# Patient Record
Sex: Female | Born: 1950 | Race: White | Hispanic: No | Marital: Married | Smoking: Never smoker
Health system: Southern US, Community
[De-identification: ages and names within clinical notes are randomized; demographics above are authoritative.]

## PROBLEM LIST (undated history)

## (undated) DIAGNOSIS — I10 Essential (primary) hypertension: Secondary | ICD-10-CM

## (undated) DIAGNOSIS — E039 Hypothyroidism, unspecified: Secondary | ICD-10-CM

## (undated) DIAGNOSIS — C50919 Malignant neoplasm of unspecified site of unspecified female breast: Secondary | ICD-10-CM

## (undated) HISTORY — PX: BREAST BIOPSY: SHX20

---

## 2019-06-28 DIAGNOSIS — Z01818 Encounter for other preprocedural examination: Secondary | ICD-10-CM | POA: Diagnosis not present

## 2019-06-29 DIAGNOSIS — Z0000 Encounter for general adult medical examination without abnormal findings: Secondary | ICD-10-CM | POA: Diagnosis not present

## 2019-06-29 DIAGNOSIS — Z13820 Encounter for screening for osteoporosis: Secondary | ICD-10-CM | POA: Diagnosis not present

## 2019-06-29 DIAGNOSIS — N959 Unspecified menopausal and perimenopausal disorder: Secondary | ICD-10-CM | POA: Diagnosis not present

## 2019-06-29 DIAGNOSIS — R300 Dysuria: Secondary | ICD-10-CM | POA: Diagnosis not present

## 2019-06-29 DIAGNOSIS — C8372 Burkitt lymphoma, intrathoracic lymph nodes: Secondary | ICD-10-CM | POA: Diagnosis not present

## 2019-07-07 DIAGNOSIS — M545 Low back pain: Secondary | ICD-10-CM | POA: Diagnosis not present

## 2019-07-07 DIAGNOSIS — M5136 Other intervertebral disc degeneration, lumbar region: Secondary | ICD-10-CM | POA: Diagnosis not present

## 2019-07-11 DIAGNOSIS — G4733 Obstructive sleep apnea (adult) (pediatric): Secondary | ICD-10-CM | POA: Diagnosis not present

## 2019-07-11 DIAGNOSIS — C8378 Burkitt lymphoma, lymph nodes of multiple sites: Secondary | ICD-10-CM | POA: Diagnosis not present

## 2019-07-11 DIAGNOSIS — Z79899 Other long term (current) drug therapy: Secondary | ICD-10-CM | POA: Diagnosis not present

## 2019-07-11 DIAGNOSIS — Z9989 Dependence on other enabling machines and devices: Secondary | ICD-10-CM | POA: Diagnosis not present

## 2019-07-16 DIAGNOSIS — M5136 Other intervertebral disc degeneration, lumbar region: Secondary | ICD-10-CM | POA: Diagnosis not present

## 2019-07-23 DIAGNOSIS — M5136 Other intervertebral disc degeneration, lumbar region: Secondary | ICD-10-CM | POA: Diagnosis not present

## 2019-07-23 DIAGNOSIS — M545 Low back pain: Secondary | ICD-10-CM | POA: Diagnosis not present

## 2019-07-23 DIAGNOSIS — M5416 Radiculopathy, lumbar region: Secondary | ICD-10-CM | POA: Diagnosis not present

## 2019-07-25 DIAGNOSIS — M8589 Other specified disorders of bone density and structure, multiple sites: Secondary | ICD-10-CM | POA: Diagnosis not present

## 2019-07-25 DIAGNOSIS — N959 Unspecified menopausal and perimenopausal disorder: Secondary | ICD-10-CM | POA: Diagnosis not present

## 2019-07-25 DIAGNOSIS — Z13820 Encounter for screening for osteoporosis: Secondary | ICD-10-CM | POA: Diagnosis not present

## 2019-07-25 DIAGNOSIS — Z780 Asymptomatic menopausal state: Secondary | ICD-10-CM | POA: Diagnosis not present

## 2019-07-31 DIAGNOSIS — M5416 Radiculopathy, lumbar region: Secondary | ICD-10-CM | POA: Diagnosis not present

## 2019-08-06 DIAGNOSIS — Z01818 Encounter for other preprocedural examination: Secondary | ICD-10-CM | POA: Diagnosis not present

## 2019-08-08 DIAGNOSIS — Z86010 Personal history of colonic polyps: Secondary | ICD-10-CM | POA: Diagnosis not present

## 2019-08-08 DIAGNOSIS — I10 Essential (primary) hypertension: Secondary | ICD-10-CM | POA: Diagnosis not present

## 2019-08-08 DIAGNOSIS — Z79899 Other long term (current) drug therapy: Secondary | ICD-10-CM | POA: Diagnosis not present

## 2019-08-08 DIAGNOSIS — F329 Major depressive disorder, single episode, unspecified: Secondary | ICD-10-CM | POA: Diagnosis not present

## 2019-08-08 DIAGNOSIS — D123 Benign neoplasm of transverse colon: Secondary | ICD-10-CM | POA: Diagnosis not present

## 2019-08-08 DIAGNOSIS — Z1211 Encounter for screening for malignant neoplasm of colon: Secondary | ICD-10-CM | POA: Diagnosis not present

## 2019-08-08 DIAGNOSIS — D125 Benign neoplasm of sigmoid colon: Secondary | ICD-10-CM | POA: Diagnosis not present

## 2019-08-08 DIAGNOSIS — Z9049 Acquired absence of other specified parts of digestive tract: Secondary | ICD-10-CM | POA: Diagnosis not present

## 2019-08-08 DIAGNOSIS — D120 Benign neoplasm of cecum: Secondary | ICD-10-CM | POA: Diagnosis not present

## 2019-08-08 DIAGNOSIS — K5730 Diverticulosis of large intestine without perforation or abscess without bleeding: Secondary | ICD-10-CM | POA: Diagnosis not present

## 2019-08-08 DIAGNOSIS — E039 Hypothyroidism, unspecified: Secondary | ICD-10-CM | POA: Diagnosis not present

## 2019-08-08 DIAGNOSIS — K649 Unspecified hemorrhoids: Secondary | ICD-10-CM | POA: Diagnosis not present

## 2019-08-10 DIAGNOSIS — D3132 Benign neoplasm of left choroid: Secondary | ICD-10-CM | POA: Diagnosis not present

## 2019-08-13 DIAGNOSIS — F4323 Adjustment disorder with mixed anxiety and depressed mood: Secondary | ICD-10-CM | POA: Diagnosis not present

## 2019-08-22 DIAGNOSIS — N390 Urinary tract infection, site not specified: Secondary | ICD-10-CM | POA: Diagnosis not present

## 2019-08-22 DIAGNOSIS — Z6831 Body mass index (BMI) 31.0-31.9, adult: Secondary | ICD-10-CM | POA: Diagnosis not present

## 2019-08-22 DIAGNOSIS — R300 Dysuria: Secondary | ICD-10-CM | POA: Diagnosis not present

## 2019-08-24 DIAGNOSIS — Z23 Encounter for immunization: Secondary | ICD-10-CM | POA: Diagnosis not present

## 2019-08-29 DIAGNOSIS — M545 Low back pain: Secondary | ICD-10-CM | POA: Diagnosis not present

## 2019-08-29 DIAGNOSIS — M5136 Other intervertebral disc degeneration, lumbar region: Secondary | ICD-10-CM | POA: Diagnosis not present

## 2019-09-20 DIAGNOSIS — F4323 Adjustment disorder with mixed anxiety and depressed mood: Secondary | ICD-10-CM | POA: Diagnosis not present

## 2019-09-21 DIAGNOSIS — Z23 Encounter for immunization: Secondary | ICD-10-CM | POA: Diagnosis not present

## 2019-11-12 DIAGNOSIS — J0100 Acute maxillary sinusitis, unspecified: Secondary | ICD-10-CM | POA: Diagnosis not present

## 2020-01-12 DIAGNOSIS — N3000 Acute cystitis without hematuria: Secondary | ICD-10-CM | POA: Diagnosis not present

## 2020-01-12 DIAGNOSIS — N3946 Mixed incontinence: Secondary | ICD-10-CM | POA: Diagnosis not present

## 2020-01-12 DIAGNOSIS — I10 Essential (primary) hypertension: Secondary | ICD-10-CM | POA: Diagnosis not present

## 2020-01-12 DIAGNOSIS — E039 Hypothyroidism, unspecified: Secondary | ICD-10-CM | POA: Diagnosis not present

## 2020-02-07 DIAGNOSIS — M67431 Ganglion, right wrist: Secondary | ICD-10-CM | POA: Diagnosis not present

## 2020-02-07 DIAGNOSIS — M25531 Pain in right wrist: Secondary | ICD-10-CM | POA: Diagnosis not present

## 2020-02-08 DIAGNOSIS — Z1231 Encounter for screening mammogram for malignant neoplasm of breast: Secondary | ICD-10-CM | POA: Diagnosis not present

## 2020-02-13 DIAGNOSIS — R922 Inconclusive mammogram: Secondary | ICD-10-CM | POA: Diagnosis not present

## 2020-02-26 DIAGNOSIS — C8372 Burkitt lymphoma, intrathoracic lymph nodes: Secondary | ICD-10-CM | POA: Diagnosis not present

## 2020-02-26 DIAGNOSIS — E039 Hypothyroidism, unspecified: Secondary | ICD-10-CM | POA: Diagnosis not present

## 2020-02-26 DIAGNOSIS — D649 Anemia, unspecified: Secondary | ICD-10-CM | POA: Diagnosis not present

## 2020-02-26 DIAGNOSIS — I10 Essential (primary) hypertension: Secondary | ICD-10-CM | POA: Diagnosis not present

## 2020-06-16 DIAGNOSIS — N390 Urinary tract infection, site not specified: Secondary | ICD-10-CM | POA: Diagnosis not present

## 2020-06-16 DIAGNOSIS — C8372 Burkitt lymphoma, intrathoracic lymph nodes: Secondary | ICD-10-CM | POA: Diagnosis not present

## 2020-06-16 DIAGNOSIS — R300 Dysuria: Secondary | ICD-10-CM | POA: Diagnosis not present

## 2020-06-16 DIAGNOSIS — R350 Frequency of micturition: Secondary | ICD-10-CM | POA: Diagnosis not present

## 2020-06-25 DIAGNOSIS — Z23 Encounter for immunization: Secondary | ICD-10-CM | POA: Diagnosis not present

## 2020-07-03 DIAGNOSIS — C8370 Burkitt lymphoma, unspecified site: Secondary | ICD-10-CM | POA: Diagnosis not present

## 2020-07-03 DIAGNOSIS — E039 Hypothyroidism, unspecified: Secondary | ICD-10-CM | POA: Diagnosis not present

## 2020-07-03 DIAGNOSIS — Z885 Allergy status to narcotic agent status: Secondary | ICD-10-CM | POA: Diagnosis not present

## 2020-07-03 DIAGNOSIS — Z79890 Hormone replacement therapy (postmenopausal): Secondary | ICD-10-CM | POA: Diagnosis not present

## 2020-07-03 DIAGNOSIS — F17200 Nicotine dependence, unspecified, uncomplicated: Secondary | ICD-10-CM | POA: Diagnosis not present

## 2020-07-03 DIAGNOSIS — C8373 Burkitt lymphoma, intra-abdominal lymph nodes: Secondary | ICD-10-CM | POA: Diagnosis not present

## 2020-07-03 DIAGNOSIS — Z801 Family history of malignant neoplasm of trachea, bronchus and lung: Secondary | ICD-10-CM | POA: Diagnosis not present

## 2020-07-03 DIAGNOSIS — I10 Essential (primary) hypertension: Secondary | ICD-10-CM | POA: Diagnosis not present

## 2020-07-03 DIAGNOSIS — Z9049 Acquired absence of other specified parts of digestive tract: Secondary | ICD-10-CM | POA: Diagnosis not present

## 2020-07-03 DIAGNOSIS — Z6831 Body mass index (BMI) 31.0-31.9, adult: Secondary | ICD-10-CM | POA: Diagnosis not present

## 2020-07-10 DIAGNOSIS — U071 COVID-19: Secondary | ICD-10-CM | POA: Diagnosis not present

## 2020-07-15 DIAGNOSIS — R0981 Nasal congestion: Secondary | ICD-10-CM | POA: Diagnosis not present

## 2020-07-15 DIAGNOSIS — R0602 Shortness of breath: Secondary | ICD-10-CM | POA: Diagnosis not present

## 2020-07-15 DIAGNOSIS — U071 COVID-19: Secondary | ICD-10-CM | POA: Diagnosis not present

## 2020-07-15 DIAGNOSIS — R059 Cough, unspecified: Secondary | ICD-10-CM | POA: Diagnosis not present

## 2020-07-15 DIAGNOSIS — M546 Pain in thoracic spine: Secondary | ICD-10-CM | POA: Diagnosis not present

## 2020-07-17 DIAGNOSIS — E039 Hypothyroidism, unspecified: Secondary | ICD-10-CM | POA: Diagnosis not present

## 2020-07-17 DIAGNOSIS — I10 Essential (primary) hypertension: Secondary | ICD-10-CM | POA: Diagnosis not present

## 2020-07-31 DIAGNOSIS — J0140 Acute pansinusitis, unspecified: Secondary | ICD-10-CM | POA: Diagnosis not present

## 2020-07-31 DIAGNOSIS — Z6830 Body mass index (BMI) 30.0-30.9, adult: Secondary | ICD-10-CM | POA: Diagnosis not present

## 2020-08-16 DIAGNOSIS — N390 Urinary tract infection, site not specified: Secondary | ICD-10-CM | POA: Diagnosis not present

## 2020-09-01 DIAGNOSIS — N390 Urinary tract infection, site not specified: Secondary | ICD-10-CM | POA: Diagnosis not present

## 2020-09-01 DIAGNOSIS — Z6830 Body mass index (BMI) 30.0-30.9, adult: Secondary | ICD-10-CM | POA: Diagnosis not present

## 2020-09-01 DIAGNOSIS — C8370 Burkitt lymphoma, unspecified site: Secondary | ICD-10-CM | POA: Diagnosis not present

## 2020-09-01 DIAGNOSIS — R319 Hematuria, unspecified: Secondary | ICD-10-CM | POA: Diagnosis not present

## 2020-09-18 DIAGNOSIS — Z6830 Body mass index (BMI) 30.0-30.9, adult: Secondary | ICD-10-CM | POA: Diagnosis not present

## 2020-09-18 DIAGNOSIS — N390 Urinary tract infection, site not specified: Secondary | ICD-10-CM | POA: Diagnosis not present

## 2020-09-18 DIAGNOSIS — R739 Hyperglycemia, unspecified: Secondary | ICD-10-CM | POA: Diagnosis not present

## 2020-09-18 DIAGNOSIS — R252 Cramp and spasm: Secondary | ICD-10-CM | POA: Diagnosis not present

## 2020-09-18 DIAGNOSIS — I10 Essential (primary) hypertension: Secondary | ICD-10-CM | POA: Diagnosis not present

## 2020-10-21 DIAGNOSIS — N390 Urinary tract infection, site not specified: Secondary | ICD-10-CM | POA: Diagnosis not present

## 2020-10-21 DIAGNOSIS — Z466 Encounter for fitting and adjustment of urinary device: Secondary | ICD-10-CM | POA: Diagnosis not present

## 2020-11-19 DIAGNOSIS — Z03818 Encounter for observation for suspected exposure to other biological agents ruled out: Secondary | ICD-10-CM | POA: Diagnosis not present

## 2020-11-19 DIAGNOSIS — K529 Noninfective gastroenteritis and colitis, unspecified: Secondary | ICD-10-CM | POA: Diagnosis not present

## 2020-11-24 DIAGNOSIS — N390 Urinary tract infection, site not specified: Secondary | ICD-10-CM | POA: Diagnosis not present

## 2020-11-24 DIAGNOSIS — Z6829 Body mass index (BMI) 29.0-29.9, adult: Secondary | ICD-10-CM | POA: Diagnosis not present

## 2021-01-10 DIAGNOSIS — Z20822 Contact with and (suspected) exposure to covid-19: Secondary | ICD-10-CM | POA: Diagnosis not present

## 2021-01-10 DIAGNOSIS — U071 COVID-19: Secondary | ICD-10-CM | POA: Diagnosis not present

## 2021-02-16 DIAGNOSIS — H903 Sensorineural hearing loss, bilateral: Secondary | ICD-10-CM | POA: Diagnosis not present

## 2021-03-27 DIAGNOSIS — G4762 Sleep related leg cramps: Secondary | ICD-10-CM | POA: Diagnosis not present

## 2021-03-27 DIAGNOSIS — I10 Essential (primary) hypertension: Secondary | ICD-10-CM | POA: Diagnosis not present

## 2021-03-27 DIAGNOSIS — M79671 Pain in right foot: Secondary | ICD-10-CM | POA: Diagnosis not present

## 2021-03-27 DIAGNOSIS — R252 Cramp and spasm: Secondary | ICD-10-CM | POA: Diagnosis not present

## 2021-03-27 DIAGNOSIS — Z0000 Encounter for general adult medical examination without abnormal findings: Secondary | ICD-10-CM | POA: Diagnosis not present

## 2021-03-27 DIAGNOSIS — M722 Plantar fascial fibromatosis: Secondary | ICD-10-CM | POA: Diagnosis not present

## 2021-03-27 DIAGNOSIS — R590 Localized enlarged lymph nodes: Secondary | ICD-10-CM | POA: Diagnosis not present

## 2021-04-07 ENCOUNTER — Other Ambulatory Visit: Payer: Self-pay | Admitting: Internal Medicine

## 2021-04-07 DIAGNOSIS — Z1231 Encounter for screening mammogram for malignant neoplasm of breast: Secondary | ICD-10-CM

## 2021-04-13 DIAGNOSIS — M25531 Pain in right wrist: Secondary | ICD-10-CM | POA: Diagnosis not present

## 2021-04-13 DIAGNOSIS — M67431 Ganglion, right wrist: Secondary | ICD-10-CM | POA: Diagnosis not present

## 2021-05-11 ENCOUNTER — Other Ambulatory Visit: Payer: Self-pay

## 2021-05-11 ENCOUNTER — Ambulatory Visit
Admission: RE | Admit: 2021-05-11 | Discharge: 2021-05-11 | Disposition: A | Discharge status: Home / Self Care | Payer: BC Managed Care – PPO | Source: Ambulatory Visit | Attending: Internal Medicine | Admitting: Internal Medicine

## 2021-05-11 DIAGNOSIS — Z1231 Encounter for screening mammogram for malignant neoplasm of breast: Secondary | ICD-10-CM

## 2021-05-12 DIAGNOSIS — R7303 Prediabetes: Secondary | ICD-10-CM | POA: Diagnosis not present

## 2021-05-12 DIAGNOSIS — E039 Hypothyroidism, unspecified: Secondary | ICD-10-CM | POA: Diagnosis not present

## 2021-05-12 DIAGNOSIS — Z0000 Encounter for general adult medical examination without abnormal findings: Secondary | ICD-10-CM | POA: Diagnosis not present

## 2021-05-12 DIAGNOSIS — N1831 Chronic kidney disease, stage 3a: Secondary | ICD-10-CM | POA: Diagnosis not present

## 2021-06-02 ENCOUNTER — Other Ambulatory Visit: Payer: Self-pay | Admitting: Internal Medicine

## 2021-06-02 DIAGNOSIS — R928 Other abnormal and inconclusive findings on diagnostic imaging of breast: Secondary | ICD-10-CM

## 2021-06-25 ENCOUNTER — Other Ambulatory Visit: Payer: Self-pay | Admitting: Internal Medicine

## 2021-06-25 ENCOUNTER — Ambulatory Visit
Admission: RE | Admit: 2021-06-25 | Discharge: 2021-06-25 | Disposition: A | Discharge status: Home / Self Care | Payer: BC Managed Care – PPO | Source: Ambulatory Visit | Attending: Internal Medicine | Admitting: Internal Medicine

## 2021-06-25 DIAGNOSIS — R922 Inconclusive mammogram: Secondary | ICD-10-CM | POA: Diagnosis not present

## 2021-06-25 DIAGNOSIS — R928 Other abnormal and inconclusive findings on diagnostic imaging of breast: Secondary | ICD-10-CM

## 2021-06-25 DIAGNOSIS — N6310 Unspecified lump in the right breast, unspecified quadrant: Secondary | ICD-10-CM

## 2021-06-26 DIAGNOSIS — R2231 Localized swelling, mass and lump, right upper limb: Secondary | ICD-10-CM | POA: Diagnosis not present

## 2021-06-26 DIAGNOSIS — G8918 Other acute postprocedural pain: Secondary | ICD-10-CM | POA: Diagnosis not present

## 2021-06-26 DIAGNOSIS — D2111 Benign neoplasm of connective and other soft tissue of right upper limb, including shoulder: Secondary | ICD-10-CM | POA: Diagnosis not present

## 2021-07-02 ENCOUNTER — Other Ambulatory Visit: Payer: BC Managed Care – PPO

## 2021-07-07 ENCOUNTER — Ambulatory Visit
Admission: RE | Admit: 2021-07-07 | Discharge: 2021-07-07 | Disposition: A | Discharge status: Home / Self Care | Payer: BC Managed Care – PPO | Source: Ambulatory Visit | Attending: Internal Medicine | Admitting: Internal Medicine

## 2021-07-07 DIAGNOSIS — C50411 Malignant neoplasm of upper-outer quadrant of right female breast: Secondary | ICD-10-CM | POA: Diagnosis not present

## 2021-07-07 DIAGNOSIS — N6310 Unspecified lump in the right breast, unspecified quadrant: Secondary | ICD-10-CM

## 2021-07-09 DIAGNOSIS — M25631 Stiffness of right wrist, not elsewhere classified: Secondary | ICD-10-CM | POA: Diagnosis not present

## 2021-07-09 DIAGNOSIS — Z4789 Encounter for other orthopedic aftercare: Secondary | ICD-10-CM | POA: Diagnosis not present

## 2021-07-14 DIAGNOSIS — C50911 Malignant neoplasm of unspecified site of right female breast: Secondary | ICD-10-CM | POA: Diagnosis not present

## 2021-07-17 DIAGNOSIS — C8370 Burkitt lymphoma, unspecified site: Secondary | ICD-10-CM | POA: Diagnosis not present

## 2021-07-17 DIAGNOSIS — Z6828 Body mass index (BMI) 28.0-28.9, adult: Secondary | ICD-10-CM | POA: Diagnosis not present

## 2021-07-17 DIAGNOSIS — C50911 Malignant neoplasm of unspecified site of right female breast: Secondary | ICD-10-CM | POA: Diagnosis not present

## 2021-07-19 HISTORY — PX: BREAST BIOPSY: SHX20

## 2021-08-04 DIAGNOSIS — C50911 Malignant neoplasm of unspecified site of right female breast: Secondary | ICD-10-CM | POA: Diagnosis not present

## 2021-08-04 DIAGNOSIS — Z170 Estrogen receptor positive status [ER+]: Secondary | ICD-10-CM | POA: Diagnosis not present

## 2021-08-04 DIAGNOSIS — I10 Essential (primary) hypertension: Secondary | ICD-10-CM | POA: Diagnosis not present

## 2021-08-04 DIAGNOSIS — C50919 Malignant neoplasm of unspecified site of unspecified female breast: Secondary | ICD-10-CM | POA: Diagnosis not present

## 2021-08-04 DIAGNOSIS — Z6829 Body mass index (BMI) 29.0-29.9, adult: Secondary | ICD-10-CM | POA: Diagnosis not present

## 2021-08-17 DIAGNOSIS — C50411 Malignant neoplasm of upper-outer quadrant of right female breast: Secondary | ICD-10-CM | POA: Diagnosis not present

## 2021-08-17 DIAGNOSIS — C50911 Malignant neoplasm of unspecified site of right female breast: Secondary | ICD-10-CM | POA: Diagnosis not present

## 2021-08-21 DIAGNOSIS — F32A Depression, unspecified: Secondary | ICD-10-CM | POA: Diagnosis not present

## 2021-08-21 DIAGNOSIS — C50911 Malignant neoplasm of unspecified site of right female breast: Secondary | ICD-10-CM | POA: Diagnosis not present

## 2021-08-21 DIAGNOSIS — Z885 Allergy status to narcotic agent status: Secondary | ICD-10-CM | POA: Diagnosis not present

## 2021-08-21 DIAGNOSIS — Z801 Family history of malignant neoplasm of trachea, bronchus and lung: Secondary | ICD-10-CM | POA: Diagnosis not present

## 2021-08-21 DIAGNOSIS — I10 Essential (primary) hypertension: Secondary | ICD-10-CM | POA: Diagnosis not present

## 2021-08-21 DIAGNOSIS — Z170 Estrogen receptor positive status [ER+]: Secondary | ICD-10-CM | POA: Diagnosis not present

## 2021-08-21 DIAGNOSIS — Z9889 Other specified postprocedural states: Secondary | ICD-10-CM | POA: Diagnosis not present

## 2021-08-21 DIAGNOSIS — E079 Disorder of thyroid, unspecified: Secondary | ICD-10-CM | POA: Diagnosis not present

## 2021-09-01 DIAGNOSIS — Z170 Estrogen receptor positive status [ER+]: Secondary | ICD-10-CM | POA: Diagnosis not present

## 2021-09-01 DIAGNOSIS — C50911 Malignant neoplasm of unspecified site of right female breast: Secondary | ICD-10-CM | POA: Diagnosis not present

## 2021-09-01 DIAGNOSIS — Z483 Aftercare following surgery for neoplasm: Secondary | ICD-10-CM | POA: Diagnosis not present

## 2021-09-01 DIAGNOSIS — Z9011 Acquired absence of right breast and nipple: Secondary | ICD-10-CM | POA: Diagnosis not present

## 2021-09-02 DIAGNOSIS — E079 Disorder of thyroid, unspecified: Secondary | ICD-10-CM | POA: Diagnosis not present

## 2021-09-02 DIAGNOSIS — Z8042 Family history of malignant neoplasm of prostate: Secondary | ICD-10-CM | POA: Diagnosis not present

## 2021-09-02 DIAGNOSIS — Z170 Estrogen receptor positive status [ER+]: Secondary | ICD-10-CM | POA: Diagnosis not present

## 2021-09-02 DIAGNOSIS — I10 Essential (primary) hypertension: Secondary | ICD-10-CM | POA: Diagnosis not present

## 2021-09-02 DIAGNOSIS — Z923 Personal history of irradiation: Secondary | ICD-10-CM | POA: Diagnosis not present

## 2021-09-02 DIAGNOSIS — M791 Myalgia, unspecified site: Secondary | ICD-10-CM | POA: Diagnosis not present

## 2021-09-02 DIAGNOSIS — Z6830 Body mass index (BMI) 30.0-30.9, adult: Secondary | ICD-10-CM | POA: Diagnosis not present

## 2021-09-02 DIAGNOSIS — C8373 Burkitt lymphoma, intra-abdominal lymph nodes: Secondary | ICD-10-CM | POA: Diagnosis not present

## 2021-09-02 DIAGNOSIS — C50911 Malignant neoplasm of unspecified site of right female breast: Secondary | ICD-10-CM | POA: Diagnosis not present

## 2021-09-02 DIAGNOSIS — Z801 Family history of malignant neoplasm of trachea, bronchus and lung: Secondary | ICD-10-CM | POA: Diagnosis not present

## 2021-09-02 DIAGNOSIS — Z792 Long term (current) use of antibiotics: Secondary | ICD-10-CM | POA: Diagnosis not present

## 2021-09-02 DIAGNOSIS — Z885 Allergy status to narcotic agent status: Secondary | ICD-10-CM | POA: Diagnosis not present

## 2021-09-04 DIAGNOSIS — L814 Other melanin hyperpigmentation: Secondary | ICD-10-CM | POA: Diagnosis not present

## 2021-09-04 DIAGNOSIS — L570 Actinic keratosis: Secondary | ICD-10-CM | POA: Diagnosis not present

## 2021-09-08 DIAGNOSIS — Z6829 Body mass index (BMI) 29.0-29.9, adult: Secondary | ICD-10-CM | POA: Diagnosis not present

## 2021-09-08 DIAGNOSIS — Z170 Estrogen receptor positive status [ER+]: Secondary | ICD-10-CM | POA: Diagnosis not present

## 2021-09-08 DIAGNOSIS — C50811 Malignant neoplasm of overlapping sites of right female breast: Secondary | ICD-10-CM | POA: Diagnosis not present

## 2022-05-24 ENCOUNTER — Emergency Department (HOSPITAL_COMMUNITY)
Admission: EM | Admit: 2022-05-24 | Discharge: 2022-05-24 | Disposition: A | Discharge status: Home / Self Care | Payer: No Typology Code available for payment source | Attending: Emergency Medicine | Admitting: Emergency Medicine

## 2022-05-24 ENCOUNTER — Emergency Department (HOSPITAL_COMMUNITY): Payer: No Typology Code available for payment source

## 2022-05-24 DIAGNOSIS — R0602 Shortness of breath: Secondary | ICD-10-CM | POA: Insufficient documentation

## 2022-05-24 DIAGNOSIS — R0781 Pleurodynia: Secondary | ICD-10-CM | POA: Diagnosis not present

## 2022-05-24 DIAGNOSIS — Y92410 Unspecified street and highway as the place of occurrence of the external cause: Secondary | ICD-10-CM | POA: Diagnosis not present

## 2022-05-24 DIAGNOSIS — M25512 Pain in left shoulder: Secondary | ICD-10-CM | POA: Insufficient documentation

## 2022-05-24 DIAGNOSIS — M25511 Pain in right shoulder: Secondary | ICD-10-CM | POA: Diagnosis not present

## 2022-05-24 MED ORDER — HYDROCODONE-ACETAMINOPHEN 5-325 MG PO TABS: 1.0000 | ORAL_TABLET | Freq: Four times a day (QID) | ORAL | 0 refills | Status: DC | PRN

## 2022-05-24 MED ORDER — METHOCARBAMOL 500 MG PO TABS: 500.0000 mg | ORAL_TABLET | Freq: Three times a day (TID) | ORAL | 0 refills | Status: DC | PRN

## 2022-06-28 ENCOUNTER — Encounter (HOSPITAL_BASED_OUTPATIENT_CLINIC_OR_DEPARTMENT_OTHER): Payer: Self-pay | Admitting: Emergency Medicine

## 2022-06-28 ENCOUNTER — Inpatient Hospital Stay (HOSPITAL_BASED_OUTPATIENT_CLINIC_OR_DEPARTMENT_OTHER)
Admission: EM | Admit: 2022-06-28 | Discharge: 2022-06-30 | DRG: 202 | Disposition: A | Discharge status: Home / Self Care | Payer: Medicare Other | Attending: Internal Medicine | Admitting: Internal Medicine

## 2022-06-28 ENCOUNTER — Emergency Department (HOSPITAL_BASED_OUTPATIENT_CLINIC_OR_DEPARTMENT_OTHER): Payer: Medicare Other

## 2022-06-28 ENCOUNTER — Other Ambulatory Visit: Payer: Self-pay

## 2022-06-28 ENCOUNTER — Encounter (HOSPITAL_COMMUNITY): Payer: Self-pay

## 2022-06-28 DIAGNOSIS — Z79890 Hormone replacement therapy (postmenopausal): Secondary | ICD-10-CM | POA: Diagnosis not present

## 2022-06-28 DIAGNOSIS — Z885 Allergy status to narcotic agent status: Secondary | ICD-10-CM

## 2022-06-28 DIAGNOSIS — J9601 Acute respiratory failure with hypoxia: Secondary | ICD-10-CM | POA: Diagnosis not present

## 2022-06-28 DIAGNOSIS — E785 Hyperlipidemia, unspecified: Secondary | ICD-10-CM | POA: Diagnosis not present

## 2022-06-28 DIAGNOSIS — E039 Hypothyroidism, unspecified: Secondary | ICD-10-CM | POA: Diagnosis not present

## 2022-06-28 DIAGNOSIS — Z79899 Other long term (current) drug therapy: Secondary | ICD-10-CM | POA: Diagnosis not present

## 2022-06-28 DIAGNOSIS — Z923 Personal history of irradiation: Secondary | ICD-10-CM | POA: Diagnosis not present

## 2022-06-28 DIAGNOSIS — J45901 Unspecified asthma with (acute) exacerbation: Secondary | ICD-10-CM | POA: Insufficient documentation

## 2022-06-28 DIAGNOSIS — I10 Essential (primary) hypertension: Secondary | ICD-10-CM | POA: Diagnosis not present

## 2022-06-28 DIAGNOSIS — Z1152 Encounter for screening for COVID-19: Secondary | ICD-10-CM

## 2022-06-28 DIAGNOSIS — Z853 Personal history of malignant neoplasm of breast: Secondary | ICD-10-CM | POA: Diagnosis not present

## 2022-06-28 DIAGNOSIS — B338 Other specified viral diseases: Secondary | ICD-10-CM

## 2022-06-28 DIAGNOSIS — C50919 Malignant neoplasm of unspecified site of unspecified female breast: Secondary | ICD-10-CM | POA: Diagnosis present

## 2022-06-28 DIAGNOSIS — J210 Acute bronchiolitis due to respiratory syncytial virus: Secondary | ICD-10-CM | POA: Diagnosis present

## 2022-06-28 DIAGNOSIS — J4521 Mild intermittent asthma with (acute) exacerbation: Secondary | ICD-10-CM | POA: Diagnosis not present

## 2022-06-28 HISTORY — DX: Malignant neoplasm of unspecified site of unspecified female breast: C50.919

## 2022-06-28 HISTORY — DX: Essential (primary) hypertension: I10

## 2022-06-28 HISTORY — DX: Hypothyroidism, unspecified: E03.9

## 2022-06-28 LAB — COMPREHENSIVE METABOLIC PANEL
ALT: 92 U/L — ABNORMAL HIGH (ref 0–44)
AST: 79 U/L — ABNORMAL HIGH (ref 15–41)
Albumin: 3.5 g/dL (ref 3.5–5.0)
Alkaline Phosphatase: 101 U/L (ref 38–126)
Anion gap: 12 (ref 5–15)
BUN: 14 mg/dL (ref 8–23)
CO2: 25 mmol/L (ref 22–32)
Calcium: 9.4 mg/dL (ref 8.9–10.3)
Chloride: 96 mmol/L — ABNORMAL LOW (ref 98–111)
Creatinine, Ser: 0.84 mg/dL (ref 0.44–1.00)
GFR, Estimated: >60 mL/min (ref >60)
Glucose, Bld: 139 mg/dL — ABNORMAL HIGH (ref 70–99)
Potassium: 3.7 mmol/L (ref 3.5–5.1)
Sodium: 133 mmol/L — ABNORMAL LOW (ref 135–145)
Total Bilirubin: 0.5 mg/dL (ref 0.3–1.2)
Total Protein: 7.6 g/dL (ref 6.5–8.1)

## 2022-06-28 LAB — URINALYSIS, MICROSCOPIC (REFLEX)

## 2022-06-28 LAB — RESP PANEL BY RT-PCR (RSV, FLU A&B, COVID)  RVPGX2
Influenza A by PCR: NEGATIVE
Influenza B by PCR: NEGATIVE
Resp Syncytial Virus by PCR: POSITIVE — AB
SARS Coronavirus 2 by RT PCR: NEGATIVE

## 2022-06-28 LAB — CBC WITH DIFFERENTIAL/PLATELET
Abs Immature Granulocytes: 0.08 10*3/uL — ABNORMAL HIGH (ref 0.00–0.07)
Basophils Absolute: 0 10*3/uL (ref 0.0–0.1)
Basophils Relative: 0 %
Eosinophils Absolute: 0 10*3/uL (ref 0.0–0.5)
Eosinophils Relative: 0 %
HCT: 30.9 % — ABNORMAL LOW (ref 36.0–46.0)
Hemoglobin: 10 g/dL — ABNORMAL LOW (ref 12.0–15.0)
Immature Granulocytes: 1 %
Lymphocytes Relative: 5 %
Lymphs Abs: 0.4 10*3/uL — ABNORMAL LOW (ref 0.7–4.0)
MCH: 28.7 pg (ref 26.0–34.0)
MCHC: 32.4 g/dL (ref 30.0–36.0)
MCV: 88.8 fL (ref 80.0–100.0)
Monocytes Absolute: 0.5 10*3/uL (ref 0.1–1.0)
Monocytes Relative: 7 %
Neutro Abs: 6.1 10*3/uL (ref 1.7–7.7)
Neutrophils Relative %: 87 %
Platelets: 159 10*3/uL (ref 150–400)
RBC: 3.48 MIL/uL — ABNORMAL LOW (ref 3.87–5.11)
RDW: 16.4 % — ABNORMAL HIGH (ref 11.5–15.5)
WBC: 7.1 10*3/uL (ref 4.0–10.5)
nRBC: 0 % (ref 0.0–0.2)

## 2022-06-28 LAB — URINALYSIS, ROUTINE W REFLEX MICROSCOPIC
Glucose, UA: 100 mg/dL — AB
Ketones, ur: 15 mg/dL — AB
Leukocytes,Ua: NEGATIVE
Nitrite: NEGATIVE
Protein, ur: >=300 mg/dL — AB
Specific Gravity, Urine: >=1.03 (ref 1.005–1.030)
pH: 5.5 (ref 5.0–8.0)

## 2022-06-28 LAB — LACTIC ACID, PLASMA: Lactic Acid, Venous: 1 mmol/L (ref 0.5–1.9)

## 2022-06-28 LAB — TROPONIN I (HIGH SENSITIVITY)
Troponin I (High Sensitivity): 33 ng/L — ABNORMAL HIGH (ref <18)
Troponin I (High Sensitivity): 20 ng/L — ABNORMAL HIGH (ref <18)

## 2022-06-28 LAB — BRAIN NATRIURETIC PEPTIDE: B Natriuretic Peptide: 134.2 pg/mL — ABNORMAL HIGH (ref 0.0–100.0)

## 2022-06-28 MED ORDER — ONDANSETRON HCL 4 MG/2ML IJ SOLN: 4.0000 mg | Freq: Four times a day (QID) | INTRAMUSCULAR | Status: DC | PRN

## 2022-06-28 MED ORDER — ENOXAPARIN SODIUM 40 MG/0.4ML IJ SOSY
40.0000 mg | PREFILLED_SYRINGE | INTRAMUSCULAR | Status: DC
  Administered 2022-06-28 – 2022-06-29 (×2): 40 mg via SUBCUTANEOUS
  Filled 2022-06-28 (×2): qty 0.4

## 2022-06-28 MED ORDER — SODIUM CHLORIDE 0.9 % IV BOLUS
500.0000 mL | Freq: Once | INTRAVENOUS | Status: AC
  Administered 2022-06-28: 500 mL via INTRAVENOUS

## 2022-06-28 MED ORDER — METHYLPREDNISOLONE SODIUM SUCC 125 MG IJ SOLR
125.0000 mg | Freq: Once | INTRAMUSCULAR | Status: AC
  Administered 2022-06-28: 125 mg via INTRAVENOUS
  Filled 2022-06-28: qty 2

## 2022-06-28 MED ORDER — HYDRALAZINE HCL 20 MG/ML IJ SOLN: 5.0000 mg | INTRAMUSCULAR | Status: DC | PRN

## 2022-06-28 MED ORDER — LETROZOLE 2.5 MG PO TABS
2.5000 mg | ORAL_TABLET | Freq: Every day | ORAL | Status: DC
  Administered 2022-06-29 – 2022-06-30 (×2): 2.5 mg via ORAL
  Filled 2022-06-28 (×2): qty 1

## 2022-06-28 MED ORDER — LEVOTHYROXINE SODIUM 112 MCG PO TABS
112.0000 ug | ORAL_TABLET | Freq: Every day | ORAL | Status: DC
  Administered 2022-06-29 – 2022-06-30 (×2): 112 ug via ORAL
  Filled 2022-06-28 (×2): qty 1

## 2022-06-28 MED ORDER — ACETAMINOPHEN 325 MG PO TABS: 650.0000 mg | ORAL_TABLET | Freq: Four times a day (QID) | ORAL | Status: DC | PRN

## 2022-06-28 MED ORDER — AMLODIPINE BESYLATE 2.5 MG PO TABS
2.5000 mg | ORAL_TABLET | Freq: Every day | ORAL | Status: DC
  Administered 2022-06-29 – 2022-06-30 (×2): 2.5 mg via ORAL
  Filled 2022-06-28 (×2): qty 1

## 2022-06-28 MED ORDER — MELATONIN 3 MG PO TABS
3.0000 mg | ORAL_TABLET | Freq: Once | ORAL | Status: AC
  Administered 2022-06-28: 3 mg via ORAL
  Filled 2022-06-28: qty 1

## 2022-06-28 MED ORDER — ACETAMINOPHEN 650 MG RE SUPP: 650.0000 mg | Freq: Four times a day (QID) | RECTAL | Status: DC | PRN

## 2022-06-28 MED ORDER — OXYCODONE HCL 5 MG PO TABS: 5.0000 mg | ORAL_TABLET | ORAL | Status: DC | PRN

## 2022-06-28 MED ORDER — GABAPENTIN 100 MG PO CAPS
100.0000 mg | ORAL_CAPSULE | Freq: Every day | ORAL | Status: DC
  Administered 2022-06-28 – 2022-06-29 (×2): 100 mg via ORAL
  Filled 2022-06-28 (×2): qty 1

## 2022-06-28 MED ORDER — ACETAMINOPHEN 500 MG PO TABS
1000.0000 mg | ORAL_TABLET | Freq: Once | ORAL | Status: AC
  Administered 2022-06-28: 1000 mg via ORAL
  Filled 2022-06-28: qty 2

## 2022-06-28 MED ORDER — IPRATROPIUM-ALBUTEROL 0.5-2.5 (3) MG/3ML IN SOLN
3.0000 mL | Freq: Four times a day (QID) | RESPIRATORY_TRACT | Status: DC
  Administered 2022-06-28 – 2022-06-29 (×3): 3 mL via RESPIRATORY_TRACT
  Filled 2022-06-28 (×3): qty 3

## 2022-06-28 MED ORDER — IPRATROPIUM-ALBUTEROL 0.5-2.5 (3) MG/3ML IN SOLN
3.0000 mL | Freq: Four times a day (QID) | RESPIRATORY_TRACT | Status: DC | PRN
  Administered 2022-06-28: 3 mL via RESPIRATORY_TRACT
  Filled 2022-06-28: qty 3

## 2022-06-28 MED ORDER — SODIUM CHLORIDE 0.9% FLUSH
3.0000 mL | Freq: Two times a day (BID) | INTRAVENOUS | Status: DC
  Administered 2022-06-29 – 2022-06-30 (×2): 3 mL via INTRAVENOUS
  Filled 2022-06-28: qty 3

## 2022-06-28 MED ORDER — POLYETHYLENE GLYCOL 3350 17 G PO PACK: 17.0000 g | PACK | Freq: Every day | ORAL | Status: DC | PRN

## 2022-06-28 MED ORDER — FLUTICASONE PROPIONATE 50 MCG/ACT NA SUSP
2.0000 | Freq: Every day | NASAL | Status: DC
  Administered 2022-06-29 – 2022-06-30 (×2): 2 via NASAL
  Filled 2022-06-28 (×2): qty 16

## 2022-06-28 MED ORDER — LACTATED RINGERS IV SOLN: INTRAVENOUS | Status: DC

## 2022-06-28 MED ORDER — ONDANSETRON HCL 4 MG PO TABS
4.0000 mg | ORAL_TABLET | Freq: Four times a day (QID) | ORAL | Status: DC | PRN
  Filled 2022-06-28: qty 1

## 2022-06-28 MED ORDER — ALBUTEROL SULFATE (2.5 MG/3ML) 0.083% IN NEBU
2.5000 mg | INHALATION_SOLUTION | RESPIRATORY_TRACT | Status: DC | PRN
  Administered 2022-06-28 – 2022-06-30 (×2): 2.5 mg via RESPIRATORY_TRACT
  Filled 2022-06-28 (×2): qty 3

## 2022-06-28 MED ORDER — BISACODYL 5 MG PO TBEC: 5.0000 mg | DELAYED_RELEASE_TABLET | Freq: Every day | ORAL | Status: DC | PRN

## 2022-06-28 MED ORDER — IPRATROPIUM-ALBUTEROL 0.5-2.5 (3) MG/3ML IN SOLN
3.0000 mL | Freq: Once | RESPIRATORY_TRACT | Status: AC
  Administered 2022-06-28: 3 mL via RESPIRATORY_TRACT
  Filled 2022-06-28: qty 3

## 2022-06-28 MED ORDER — ROSUVASTATIN CALCIUM 5 MG PO TABS
10.0000 mg | ORAL_TABLET | Freq: Every day | ORAL | Status: DC
  Administered 2022-06-29 – 2022-06-30 (×2): 10 mg via ORAL
  Filled 2022-06-28 (×2): qty 2

## 2022-06-28 NOTE — ED Notes (Signed)
ED Provider at bedside. 

## 2022-06-28 NOTE — ED Notes (Signed)
[facility] here to transport pt to [place]. 

## 2022-06-28 NOTE — Assessment & Plan Note (Signed)
-  s/p lumpectomy and radiation -Continue Femara

## 2022-06-28 NOTE — Assessment & Plan Note (Signed)
-   Continue amlodipine ?

## 2022-06-28 NOTE — ED Notes (Signed)
Pts spouse brought soup for pt.

## 2022-06-28 NOTE — Assessment & Plan Note (Addendum)
-  Patient presenting with cough, SOB, wheezing, viral symptoms -Found to have RSV -Hypoxia into the 80s with ambulation at the ER -Will observe overnight -Supportive care -Supplemental O2 as needed -CXR unremarkable -Nebs ordered

## 2022-06-28 NOTE — Assessment & Plan Note (Signed)
Continue Synthroid °

## 2022-06-28 NOTE — Assessment & Plan Note (Signed)
Continue rosuvastatin.  
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
## 2022-06-28 NOTE — ED Triage Notes (Signed)
Patient presents with family C/O cough, fever, diarrhea X5 days. Seen at PCP and dx with URI and given 2 different abx.

## 2022-06-28 NOTE — ED Notes (Signed)
1 set of blood cultures collected and held

## 2022-06-28 NOTE — ED Notes (Signed)
Bed side commode placed in room for pt comfort and convenience

## 2022-06-28 NOTE — H&P (Signed)
History and Physical   Patient: Victoria Zimmerman DOB: Aug 04, 1950 PCP: Merrilee Seashore, MD DOA: 06/28/2022 DOS: the patient was seen and examined on 06/28/2022 Primary service: Karmen Bongo, MD  Referring physician: Long Reason for consult: RSV.  Presented with flu-like illness.  RR 35, wheezing, h/o asthma.  Not eating well, mild diarrhea.  Febrile, tachypnea, dropping into 80s with ambulation.  Given nebs, Tylenol, IVF.  Increased wheezing with ongoing increased WOB.  Likely needs supportive care overnight with treatment for asthma.      Assessment and Plan: * RSV bronchiolitis -Patient presenting with cough, SOB, wheezing, viral symptoms -Found to have RSV -Hypoxia into the 80s with ambulation at the ER -Will observe overnight -Supportive care -Supplemental O2 as needed -CXR unremarkable -Nebs ordered  Breast cancer ([place]) -s/p lumpectomy and radiation -Continue Femara  Dyslipidemia -Continue rosuvastatin  Hypothyroidism -Continue Synthroid  Essential hypertension -Continue amlodipine       HPI: Victoria Zimmerman is a 71 y.o. female with past medical history of breast cancer, intermittent asthma, and HTN presenting with cough.  She reports that a week ago she went to [facility] for URI.  She was given steroids and antibiotics and was COVID negative.  She completed abx on Friday and was no better.  He called in a Zpack but she was no better and so called today.  She has a deep cough.  +SOB, weakness.  No home O2.  Cough is productive of yellow sputum.  If she eats, she has diarrhea.  Intermittent fever..  Initial visit was done via virtual visit while the patient was at [facility].  Subsequent visit was in person upon arrival at [facility].  She reports already feeling better, less SOB, improving cough.   Review of Systems: As mentioned in the history of present illness. All other systems reviewed and are negative. Past Medical History:  Diagnosis Date   Breast  cancer ([place])    just finished lumpectomy, taking hormonal therapy, completed radiation therapy   Hypertension    Hypothyroidism (acquired)    Past Surgical History:  Procedure Laterality Date   BREAST BIOPSY Right 07/2021   lumpectomy   Social History:  reports that she has never smoked. She has never used smokeless tobacco. She reports that she does not currently use alcohol. She reports that she does not currently use drugs.  Allergies  Allergen Reactions   Codeine Other (See Comments) and Nausea Only    Family History  Problem Relation Age of Onset   Breast cancer Neg Hx     Prior to Admission medications   Not on File    Physical Exam: Vitals:   06/28/22 1501 06/28/22 1530 06/28/22 1630 06/28/22 1804  BP: (!) 144/84 133/65 (!) 153/84 135/71  Pulse:  100 (!) 102 100  Resp: (!) 23 (!) 34 (!) 27 18  Temp: 98.1 F (36.7 C)  98.3 F (36.8 C) 99.2 F (37.3 C)  TempSrc: Oral  Oral Oral  SpO2: 97% 98% 100% 98%  Weight:      Height:        Televisit performed with assistance from RN:  General:  Appears calm and comfortable and is in NAD Eyes:  PERRL, EOMI, normal lids, iris ENT:  grossly normal hearing, lips & tongue, mild dry mm; appropriate dentition Neck:  no LAD, masses or thyromegaly Cardiovascular:  RR with mild tachycardia, no m/r/g. No LE edema.  Respiratory:   CTA bilaterally with diminished breath sounds, coarse cough.  Normal respiratory effort. Abdomen:  soft, mild diffuse TTP from coughing, ND, NABS Skin:  no rash or induration seen on limited exam Musculoskeletal:  grossly normal tone BUE/BLE, good ROM, no bony abnormality Psychiatric:  grossly normal mood and affect, speech fluent and appropriate, AOx3 Neurologic:  CN 2-12 grossly intact, moves all extremities in coordinated fashion   Radiological Exams on Admission: Independently reviewed - see discussion in A/P where applicable  DG Chest 2 View  Result Date: 06/28/2022 CLINICAL DATA:  Cough,  fever. EXAM: CHEST - 2 VIEW COMPARISON:  None Available. FINDINGS: The heart size and mediastinal contours are within normal limits. Both lungs are clear. The visualized skeletal structures are unremarkable. IMPRESSION: No active cardiopulmonary disease. Electronically Signed   By: Marijo Conception M.D.   On: 06/28/2022 10:12    EKG: Independently reviewed.  Sinus tachycardia with rate 117; nonspecific ST changes with no evidence of acute ischemia   Labs on Admission: I have personally reviewed the available labs and imaging studies at the time of the admission.  Pertinent labs:    Na++ 133 Glucose 139 BNP 134.2 HS troponin 33 Lactate 1.0 WBC 7.1 Hgb 10 COVID/flu negative RSV POSITIVE UA: small bili, 100 glucose, large LE, >300 protein, many bacteria Urine culture pending   Advance Care Planning:   Code Status: Full Code    Consults: TOC team/PT/OT/Nutrition   DVT Prophylaxis: Lovenox   Family Communication: None present; she is capable of communicating with her husband at this time (he is also developing a cough)     Severity of Illness: The appropriate patient status for this patient is OBSERVATION. Observation status is judged to be reasonable and necessary in order to provide the required intensity of service to ensure the patient's safety. The patient's presenting symptoms, physical exam findings, and initial radiographic and laboratory data in the context of their medical condition is felt to place them at decreased risk for further clinical deterioration. Furthermore, it is anticipated that the patient will be medically stable for discharge from the hospital within 2 midnights of admission.     Author: Karmen Bongo, MD 06/28/2022 6:28 PM  For on call review [email or web address].

## 2022-06-28 NOTE — ED Notes (Signed)
Hospitalist e-visit in process between patient and Dr Lorin Mercy.

## 2022-06-29 DIAGNOSIS — Z1152 Encounter for screening for COVID-19: Secondary | ICD-10-CM | POA: Diagnosis not present

## 2022-06-29 DIAGNOSIS — Z79899 Other long term (current) drug therapy: Secondary | ICD-10-CM | POA: Diagnosis not present

## 2022-06-29 DIAGNOSIS — J210 Acute bronchiolitis due to respiratory syncytial virus: Secondary | ICD-10-CM | POA: Diagnosis present

## 2022-06-29 DIAGNOSIS — J9601 Acute respiratory failure with hypoxia: Principal | ICD-10-CM | POA: Insufficient documentation

## 2022-06-29 DIAGNOSIS — I10 Essential (primary) hypertension: Secondary | ICD-10-CM | POA: Diagnosis present

## 2022-06-29 DIAGNOSIS — E039 Hypothyroidism, unspecified: Secondary | ICD-10-CM | POA: Diagnosis present

## 2022-06-29 DIAGNOSIS — J4521 Mild intermittent asthma with (acute) exacerbation: Secondary | ICD-10-CM | POA: Diagnosis present

## 2022-06-29 DIAGNOSIS — Z79890 Hormone replacement therapy (postmenopausal): Secondary | ICD-10-CM | POA: Diagnosis not present

## 2022-06-29 DIAGNOSIS — Z885 Allergy status to narcotic agent status: Secondary | ICD-10-CM | POA: Diagnosis not present

## 2022-06-29 DIAGNOSIS — E785 Hyperlipidemia, unspecified: Secondary | ICD-10-CM | POA: Diagnosis present

## 2022-06-29 DIAGNOSIS — J45901 Unspecified asthma with (acute) exacerbation: Secondary | ICD-10-CM | POA: Insufficient documentation

## 2022-06-29 DIAGNOSIS — Z853 Personal history of malignant neoplasm of breast: Secondary | ICD-10-CM | POA: Diagnosis not present

## 2022-06-29 DIAGNOSIS — Z923 Personal history of irradiation: Secondary | ICD-10-CM | POA: Diagnosis not present

## 2022-06-29 LAB — CBC
HCT: 28.9 % — ABNORMAL LOW (ref 36.0–46.0)
Hemoglobin: 9.3 g/dL — ABNORMAL LOW (ref 12.0–15.0)
MCH: 29.1 pg (ref 26.0–34.0)
MCHC: 32.2 g/dL (ref 30.0–36.0)
MCV: 90.3 fL (ref 80.0–100.0)
Platelets: 149 10*3/uL — ABNORMAL LOW (ref 150–400)
RBC: 3.2 MIL/uL — ABNORMAL LOW (ref 3.87–5.11)
RDW: 16.5 % — ABNORMAL HIGH (ref 11.5–15.5)
WBC: 7.4 10*3/uL (ref 4.0–10.5)
nRBC: 0 % (ref 0.0–0.2)

## 2022-06-29 LAB — HIV ANTIBODY (ROUTINE TESTING W REFLEX): HIV Screen 4th Generation wRfx: NONREACTIVE

## 2022-06-29 LAB — BASIC METABOLIC PANEL
Anion gap: 12 (ref 5–15)
BUN: 13 mg/dL (ref 8–23)
CO2: 24 mmol/L (ref 22–32)
Calcium: 9.1 mg/dL (ref 8.9–10.3)
Chloride: 101 mmol/L (ref 98–111)
Creatinine, Ser: 0.72 mg/dL (ref 0.44–1.00)
GFR, Estimated: >60 mL/min (ref >60)
Glucose, Bld: 176 mg/dL — ABNORMAL HIGH (ref 70–99)
Potassium: 3.7 mmol/L (ref 3.5–5.1)
Sodium: 137 mmol/L (ref 135–145)

## 2022-06-29 LAB — URINE CULTURE

## 2022-06-29 MED ORDER — ENSURE ENLIVE PO LIQD
237.0000 mL | Freq: Two times a day (BID) | ORAL | Status: DC
  Administered 2022-06-30: 237 mL via ORAL

## 2022-06-29 MED ORDER — ADULT MULTIVITAMIN W/MINERALS CH
1.0000 | ORAL_TABLET | Freq: Every day | ORAL | Status: DC
  Administered 2022-06-29 – 2022-06-30 (×2): 1 via ORAL
  Filled 2022-06-29 (×2): qty 1

## 2022-06-29 MED ORDER — METHYLPREDNISOLONE SODIUM SUCC 40 MG IJ SOLR
40.0000 mg | Freq: Two times a day (BID) | INTRAMUSCULAR | Status: DC
  Administered 2022-06-29 – 2022-06-30 (×3): 40 mg via INTRAVENOUS
  Filled 2022-06-29 (×3): qty 1

## 2022-06-29 MED ORDER — IPRATROPIUM-ALBUTEROL 0.5-2.5 (3) MG/3ML IN SOLN
3.0000 mL | Freq: Two times a day (BID) | RESPIRATORY_TRACT | Status: DC
  Administered 2022-06-30: 3 mL via RESPIRATORY_TRACT
  Filled 2022-06-29: qty 3

## 2022-06-29 NOTE — Progress Notes (Signed)
Initial Nutrition Assessment  DOCUMENTATION CODES:   Not applicable  INTERVENTION:   Multivitamin w/ minerals daily Ensure Enlive po BID, each supplement provides 350 kcal and 20 grams of protein. Encourage good PO intake  NUTRITION DIAGNOSIS:   Inadequate oral intake related to poor appetite as evidenced by per patient/family report.  GOAL:   Patient will meet greater than or equal to 90% of their needs  MONITOR:   PO intake, Supplement acceptance, I & O's  REASON FOR ASSESSMENT:   Consult  (Nutritional Goals)  ASSESSMENT:   71 y.o. female presented to the ED with flu-like illness. PMH includes HTN, breast cancer, and hypothyroidism. Pt admitted with RSV bronchiolitis.   Pt sleeping at time of RD visit, spoke with pt daughter that was present at bedside. Daughter able to provide history. Reports that pt had hardly anything to eat for 4 days PTA, maybe a slice of bread. Reports that last night ate 1/2 a yogurt, and today ate much better (about half a meal). Shares that prior, appetite was normal and eating much better. Pt was having diarrhea, but no nausea or vomiting. Denies any recent weight loss.  Discussed using oral nutritional supplements until pt PO improves, daughter shares that pt will drink them and is agreeable to RD ordering. Reviewed menu with daughter. No additional questions.   Medications reviewed and include: Solu-Medrol Labs reviewed.   NUTRITION - FOCUSED PHYSICAL EXAM:  Deferred to follow-up.  Diet Order:   Diet Order             Diet regular Room service appropriate? Yes; Fluid consistency: Thin  Diet effective now                   EDUCATION NEEDS:   No education needs have been identified at this time  Skin:  Skin Assessment: Reviewed RN Assessment  Last BM:  12/10  Height:   Ht Readings from Last 1 Encounters:  06/28/22 '5\' 8"'$  (1.727 m)    Weight:   Wt Readings from Last 1 Encounters:  06/28/22 83.9 kg    Ideal Body  Weight:  63.6 kg  BMI:  Body mass index is 28.13 kg/m.  Estimated Nutritional Needs:   Kcal:  1900-2100  Protein:  95-110 grams  Fluid:  >/= 1.9 L    Hermina Barters RD, LDN Clinical Dietitian See [facility] for contact information.

## 2022-06-29 NOTE — Progress Notes (Signed)
PROGRESS NOTE    Victoria Zimmerman Close Victoria Zimmerman  TOI:[id number] DOB: 1951/05/01 DOA: 06/28/2022 PCP: Merrilee Seashore, MD   Brief Narrative:  HPI: Victoria Zimmerman Close Victoria Zimmerman is a 71 y.o. female with past medical history of breast cancer, intermittent asthma, and HTN presenting with cough.  She reports that a week ago she went to [facility] for URI.  She was given steroids and antibiotics and was COVID negative.  She completed abx on Friday and was no better.  He called in a Zpack but she was no better and so called today.  She has a deep cough.  +SOB, weakness.  No home O2.  Cough is productive of yellow sputum.  If she eats, she has diarrhea.  Intermittent fever..  Initial visit was done via virtual visit while the patient was at [facility].  Subsequent visit was in person upon arrival at [facility].  She reports already feeling better, less SOB, improving cough.   Assessment & Plan:   Principal Problem:   RSV bronchiolitis Active Problems:   Essential hypertension   Hypothyroidism   Dyslipidemia   Breast cancer ([place])   Acute asthma exacerbation   Acute respiratory failure with hypoxia (HCC)  Acute hypoxic respiratory failure secondary to RSV bronchiolitis/acute asthma exacerbation: Patient is still wheezy and symptomatic and hypoxic, slightly better than yesterday but is still needs to stay in the hospital.  I will continue bronchodilators and will start her on Solu-Medrol today and will continue supportive treatment.  Potential discharge tomorrow.  Breast cancer ([place]) -s/p lumpectomy and radiation -Continue Femara   Dyslipidemia -Continue rosuvastatin   Hypothyroidism -Continue Synthroid   Essential hypertension -Continue amlodipine  DVT prophylaxis: enoxaparin (LOVENOX) injection 40 mg Start: 06/28/22 1800   Code Status: Full Code  Family Communication: Daughter present at bedside.  Plan of care discussed with patient in length and he/she verbalized understanding and agreed with it.  Status is:  Observation The patient will require care spanning > 2 midnights and should be moved to inpatient because: Patient still symptomatic and hypoxic   Estimated body mass index is 28.13 kg/m as calculated from the following:   Height as of this encounter: '5\' 8"'$  (1.727 m).   Weight as of this encounter: 83.9 kg.    Nutritional Assessment: Body mass index is 28.13 kg/m.Marland Kitchen Seen by dietician.  I agree with the assessment and plan as outlined below: Nutrition Status:        . Skin Assessment: I have examined the patient's skin and I agree with the wound assessment as performed by the wound care RN as outlined below:    Consultants:  None  Procedures:  None  Antimicrobials:  Anti-infectives (From admission, onward)    None         Subjective: Seen and examined.  Still complains of shortness of breath and cough.  No other complaint.  Slightly better than yesterday.  Objective: Vitals:   06/28/22 2138 06/29/22 0508 06/29/22 0815 06/29/22 0859  BP:  130/66 128/66   Pulse:  91 76   Resp:  20 17   Temp:  98.7 F (37.1 C) 98.7 F (37.1 C)   TempSrc:  Oral Oral   SpO2: 99% 95% 95% 95%  Weight:      Height:        Intake/Output Summary (Last 24 hours) at 06/29/2022 1000 Last data filed at 06/29/2022 0400 Gross per 24 hour  Intake 1157.03 ml  Output --  Net 1157.03 ml   [employer]  06/28/22 0910  Weight: 83.9 kg    Examination:  General exam: Appears sick and coughing. Respiratory system: Bilateral wheezes and scattered rhonchi. Respiratory effort normal. Cardiovascular system: S1 & S2 heard, RRR. No JVD, murmurs, rubs, gallops or clicks. No pedal edema. Gastrointestinal system: Abdomen is nondistended, soft and nontender. No organomegaly or masses felt. Normal bowel sounds heard. Central nervous system: Alert and oriented. No focal neurological deficits. Extremities: Symmetric 5 x 5 power. Skin: No rashes, lesions or ulcers Psychiatry: Judgement and  insight appear normal. Mood & affect appropriate.    Data Reviewed: I have personally reviewed following labs and imaging studies  CBC: Recent Labs  Lab 06/28/22 0940 06/29/22 0228  WBC 7.1 7.4  NEUTROABS 6.1  --   HGB 10.0* 9.3*  HCT 30.9* 28.9*  MCV 88.8 90.3  PLT 159 270*   Basic Metabolic Panel: Recent Labs  Lab 06/28/22 0940 06/29/22 0228  NA 133* 137  K 3.7 3.7  CL 96* 101  CO2 25 24  GLUCOSE 139* 176*  BUN 14 13  CREATININE 0.84 0.72  CALCIUM 9.4 9.1   GFR: Estimated Creatinine Clearance: 74.3 mL/min (by C-G formula based on SCr of 0.72 mg/dL). Liver Function Tests: Recent Labs  Lab 06/28/22 0940  AST 79*  ALT 92*  ALKPHOS 101  BILITOT 0.5  PROT 7.6  ALBUMIN 3.5   No results for input(s): "LIPASE", "AMYLASE" in the last 168 hours. No results for input(s): "AMMONIA" in the last 168 hours. Coagulation Profile: No results for input(s): "INR", "PROTIME" in the last 168 hours. Cardiac Enzymes: No results for input(s): "CKTOTAL", "CKMB", "CKMBINDEX", "TROPONINI" in the last 168 hours. BNP (last 3 results) No results for input(s): "PROBNP" in the last 8760 hours. HbA1C: No results for input(s): "HGBA1C" in the last 72 hours. CBG: No results for input(s): "GLUCAP" in the last 168 hours. Lipid Profile: No results for input(s): "CHOL", "HDL", "LDLCALC", "TRIG", "CHOLHDL", "LDLDIRECT" in the last 72 hours. Thyroid Function Tests: No results for input(s): "TSH", "T4TOTAL", "FREET4", "T3FREE", "THYROIDAB" in the last 72 hours. Anemia Panel: No results for input(s): "VITAMINB12", "FOLATE", "FERRITIN", "TIBC", "IRON", "RETICCTPCT" in the last 72 hours. Sepsis Labs: Recent Labs  Lab 06/28/22 0940  LATICACIDVEN 1.0    Recent Results (from the past 240 hour(s))  Resp panel by RT-PCR (RSV, Flu A&B, Covid) Anterior Nasal Swab     Status: Abnormal   Collection Time: 06/28/22  9:35 AM   Specimen: Anterior Nasal Swab  Result Value Ref Range Status   SARS  Coronavirus 2 by RT PCR NEGATIVE NEGATIVE Final    Comment: (NOTE) SARS-CoV-2 target nucleic acids are NOT DETECTED.  The SARS-CoV-2 RNA is generally detectable in upper respiratory specimens during the acute phase of infection. The lowest concentration of SARS-CoV-2 viral copies this assay can detect is 138 copies/mL. A negative result does not preclude SARS-Cov-2 infection and should not be used as the sole basis for treatment or other patient management decisions. A negative result may occur with  improper specimen collection/handling, submission of specimen other than nasopharyngeal swab, presence of viral mutation(s) within the areas targeted by this assay, and inadequate number of viral copies(<138 copies/mL). A negative result must be combined with clinical observations, patient history, and epidemiological information. The expected result is Negative.  Fact Sheet for Patients:  EntrepreneurPulse.com.au  Fact Sheet for Healthcare Providers:  IncredibleEmployment.be  This test is no t yet approved or cleared by the [place] FDA and  has been authorized for detection  and/or diagnosis of SARS-CoV-2 by FDA under an Emergency Use Authorization (EUA). This EUA will remain  in effect (meaning this test can be used) for the duration of the COVID-19 declaration under Section 564(b)(1) of the Act, 21 U.S.C.section 360bbb-3(b)(1), unless the authorization is terminated  or revoked sooner.       Influenza A by PCR NEGATIVE NEGATIVE Final   Influenza B by PCR NEGATIVE NEGATIVE Final    Comment: (NOTE) The Xpert Xpress SARS-CoV-2/FLU/RSV plus assay is intended as an aid in the diagnosis of influenza from Nasopharyngeal swab specimens and should not be used as a sole basis for treatment. Nasal washings and aspirates are unacceptable for Xpert Xpress SARS-CoV-2/FLU/RSV testing.  Fact Sheet for  Patients: EntrepreneurPulse.com.au  Fact Sheet for Healthcare Providers: IncredibleEmployment.be  This test is not yet approved or cleared by the [place] FDA and has been authorized for detection and/or diagnosis of SARS-CoV-2 by FDA under an Emergency Use Authorization (EUA). This EUA will remain in effect (meaning this test can be used) for the duration of the COVID-19 declaration under Section 564(b)(1) of the Act, 21 U.S.C. section 360bbb-3(b)(1), unless the authorization is terminated or revoked.     Resp Syncytial Virus by PCR POSITIVE (A) NEGATIVE Final    Comment: (NOTE) Fact Sheet for Patients: EntrepreneurPulse.com.au  Fact Sheet for Healthcare Providers: IncredibleEmployment.be  This test is not yet approved or cleared by the [place] FDA and has been authorized for detection and/or diagnosis of SARS-CoV-2 by FDA under an Emergency Use Authorization (EUA). This EUA will remain in effect (meaning this test can be used) for the duration of the COVID-19 declaration under Section 564(b)(1) of the Act, 21 U.S.C. section 360bbb-3(b)(1), unless the authorization is terminated or revoked.  Performed at [facility], [address], [place], [place] [address]   Urine Culture     Status: Abnormal   Collection Time: 06/28/22 10:03 AM   Specimen: Urine, Clean Catch  Result Value Ref Range Status   Specimen Description   Final    URINE, CLEAN CATCH Performed at [facility], [address] [place], [place] [address]    Special Requests   Final    NONE Performed at [facility], [address] [place], [place] [address]    Culture MULTIPLE SPECIES PRESENT, SUGGEST RECOLLECTION (A)  Final   Report Status 06/29/2022 FINAL  Final     Radiology Studies: DG Chest 2 View  Result Date: 06/28/2022 CLINICAL DATA:  Cough, fever. EXAM: CHEST - 2 VIEW COMPARISON:   None Available. FINDINGS: The heart size and mediastinal contours are within normal limits. Both lungs are clear. The visualized skeletal structures are unremarkable. IMPRESSION: No active cardiopulmonary disease. Electronically Signed   By: Marijo Conception M.D.   On: 06/28/2022 10:12    Scheduled Meds:  amLODipine  2.5 mg Oral Daily   enoxaparin (LOVENOX) injection  40 mg Subcutaneous Q24H   fluticasone  2 spray Each Nare Daily   gabapentin  100 mg Oral QHS   ipratropium-albuterol  3 mL Nebulization Q6H   letrozole  2.5 mg Oral Daily   levothyroxine  112 mcg Oral QAC breakfast   rosuvastatin  10 mg Oral Daily   sodium chloride flush  3 mL Intravenous Q12H   Continuous Infusions:  lactated ringers 75 mL/hr at 06/28/22 1827     LOS: 0 days   Darliss Cheney, MD [facility]  06/29/2022, 10:00 AM   *  Please note that this is a verbal dictation therefore any spelling or grammatical errors are due to the "[place]" system interpretation.  Please page via [place] and do not message via secure chat for urgent patient care matters. Secure chat can be used for non urgent patient care matters.  How to contact the [facility] Attending or Consulting provider [place] or covering provider during after hours [place], for this patient?  Check the care team in [facility] and look for a) attending/consulting TR[facility] provider listed and b) the [facility] team listed. Page or secure chat [phone]. Log into [email or web address] and use 's universal password to access. If you do not have the password, please contact the hospital operator. Locate the [facility] provider you are looking for under [facility] and page to a number that you can be directly reached. If you still have difficulty reaching the provider, please page the [facility] (Director on Call) for the Hospitalists listed on amion for assistance.

## 2022-06-29 NOTE — Plan of Care (Signed)
  Problem: Education: Goal: Knowledge of General Education information will improve Description: Including pain rating scale, medication(s)/side effects and non-pharmacologic comfort measures Outcome: Progressing   Problem: Coping: Goal: Level of anxiety will decrease Outcome: Progressing   Problem: Pain Managment: Goal: General experience of comfort will improve Outcome: Progressing   Problem: Safety: Goal: Ability to remain free from injury will improve Outcome: Progressing   Problem: Skin Integrity: Goal: Risk for impaired skin integrity will decrease Outcome: Progressing   Problem: Education: Goal: Knowledge of General Education information will improve Description: Including pain rating scale, medication(s)/side effects and non-pharmacologic comfort measures Outcome: Progressing   Problem: Coping: Goal: Level of anxiety will decrease Outcome: Progressing   Problem: Pain Managment: Goal: General experience of comfort will improve Outcome: Progressing   Problem: Safety: Goal: Ability to remain free from injury will improve Outcome: Progressing   Problem: Skin Integrity: Goal: Risk for impaired skin integrity will decrease Outcome: Progressing
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
## 2022-06-30 MED ORDER — IPRATROPIUM-ALBUTEROL 0.5-2.5 (3) MG/3ML IN SOLN: 3.0000 mL | Freq: Four times a day (QID) | RESPIRATORY_TRACT | 1 refills | Status: DC

## 2022-06-30 MED ORDER — PREDNISONE 10 MG PO TABS: 40.0000 mg | ORAL_TABLET | Freq: Every day | ORAL | 0 refills | Status: AC

## 2022-06-30 MED ORDER — IPRATROPIUM-ALBUTEROL 0.5-2.5 (3) MG/3ML IN SOLN
3.0000 mL | Freq: Four times a day (QID) | RESPIRATORY_TRACT | Status: DC
  Administered 2022-06-30: 3 mL via RESPIRATORY_TRACT
  Filled 2022-06-30 (×2): qty 3

## 2022-06-30 NOTE — Care Management (Signed)
  Transition of Care [facility]) Screening Note   Patient Details  Name: Victoria Zimmerman Date of Birth: 07-Dec-1950   Transition of Care [facility]) CM/SW Contact:    Carles Collet, RN Phone Number: 06/30/2022, 8:26 AM    Transition of Care Department [facility]) has reviewed patient and is following patient advancement through interdisciplinary progression rounds. If new patient transition needs arise, please place a TOC consult.

## 2022-06-30 NOTE — Discharge Summary (Signed)
Physician Discharge Summary   Patient: Victoria Zimmerman Close Victoria Zimmerman MRN: [id number] DOB: [birth year]  Admit date:     06/28/2022  Discharge date: 06/30/22  Discharge Physician: Elmarie Shiley   PCP: Merrilee Seashore, MD   Recommendations at discharge:    Follow up with PCP for resolution of RSV  Discharge Diagnoses: Principal Problem:   RSV bronchiolitis Active Problems:   Essential hypertension   Hypothyroidism   Dyslipidemia   Breast cancer ([place])   Acute asthma exacerbation   Acute respiratory failure with hypoxia ([place])  Resolved Problems:   * No resolved hospital problems. *  Hospital Course: [facility] Close Victoria Zimmerman is a 71 y.o. female with past medical history of breast cancer, intermittent asthma, and HTN presenting with cough.  She reports that a week ago she went to [facility] for URI.  She was given steroids and antibiotics and was COVID negative.  She completed abx on Friday and was no better.  He called in a Zpack but she was no better and so called today.  She has a deep cough.  +SOB, weakness.  No home O2.  Cough is productive of yellow sputum.  If she eats, she has diarrhea.  Intermittent fever..  Initial visit was done via virtual visit while the patient was at [facility].  Subsequent visit was in person upon arrival at [facility].  She reports already feeling better, less SOB, improving cough.    Assessment and Plan: Acute hypoxic respiratory failure secondary to RSV bronchiolitis/acute asthma exacerbation:  She was treated bronchodilators and IV Solu-Medrol. Continue supportive treatment.  She is feeling better, off oxygen. She has been ambulating in the hall.  Cough and dyspnea improved.  Plan to discharge home on duo-neb and prednisone for 5 days.  Stable for discharge.   Breast cancer ([place]) -s/p lumpectomy and radiation -Continue Femara   Dyslipidemia -Continue rosuvastatin   Hypothyroidism -Continue Synthroid   Essential hypertension -Continue amlodipine      Consultants:  None Procedures performed: none Disposition: Home Diet recommendation:  Discharge Diet Orders (From admission, onward)     Start     Ordered   06/30/22 0000  Diet - low sodium heart healthy        06/30/22 1142           Hearth healthy  DISCHARGE MEDICATION: Allergies as of 06/30/2022       Reactions   Codeine Other (See Comments), Nausea Only        Medication List     TAKE these medications    acetaminophen 325 MG tablet Commonly known as: TYLENOL Take 650 mg by mouth every 6 (six) hours as needed for mild pain or moderate pain.   amLODipine 2.5 MG tablet Commonly known as: NORVASC Take 2.5 mg by mouth daily.   fluticasone 50 MCG/ACT nasal spray Commonly known as: FLONASE Place into both nostrils.   gabapentin 100 MG capsule Commonly known as: NEURONTIN Take 100 mg by mouth at bedtime.   HYDROcodone-acetaminophen 5-325 MG tablet Commonly known as: NORCO/VICODIN Take by mouth.   ipratropium-albuterol 0.5-2.5 (3) MG/3ML Soln Commonly known as: DUONEB Take 3 mLs by nebulization every 6 (six) hours.   letrozole 2.5 MG tablet Commonly known as: FEMARA Take 2.5 mg by mouth daily.   levothyroxine 112 MCG tablet Commonly known as: SYNTHROID Take 112 mcg by mouth daily before breakfast. ON AN EMPTY STOMACH   predniSONE 10 MG tablet Commonly known as: DELTASONE Take 4 tablets (40 mg total) by mouth daily  with breakfast for 5 days.   rosuvastatin 10 MG tablet Commonly known as: CRESTOR Take 10 mg by mouth daily.               Durable Medical Equipment  (From admission, onward)           Start     Ordered   06/30/22 1138  For home use only DME Nebulizer machine  Once       Comments: Asthma exacerbation  Question Answer Comment  Patient needs a nebulizer to treat with the following condition Bronchitis   Length of Need 6 Months      06/30/22 1138            Discharge Exam: Filed Weights   06/28/22 0910  Weight: 83.9 kg    General NAD  Condition at discharge: stable  The results of significant diagnostics from this hospitalization (including imaging, microbiology, ancillary and laboratory) are listed below for reference.   Imaging Studies: DG Chest 2 View  Result Date: 06/28/2022 CLINICAL DATA:  Cough, fever. EXAM: CHEST - 2 VIEW COMPARISON:  None Available. FINDINGS: The heart size and mediastinal contours are within normal limits. Both lungs are clear. The visualized skeletal structures are unremarkable. IMPRESSION: No active cardiopulmonary disease. Electronically Signed   By: Marijo Conception M.D.   On: 06/28/2022 10:12    Microbiology: Results for orders placed or performed during the hospital encounter of 06/28/22  Resp panel by RT-PCR (RSV, Flu A&B, Covid) Anterior Nasal Swab     Status: Abnormal   Collection Time: 06/28/22  9:35 AM   Specimen: Anterior Nasal Swab  Result Value Ref Range Status   SARS Coronavirus 2 by RT PCR NEGATIVE NEGATIVE Final    Comment: (NOTE) SARS-CoV-2 target nucleic acids are NOT DETECTED.  The SARS-CoV-2 RNA is generally detectable in upper respiratory specimens during the acute phase of infection. The lowest concentration of SARS-CoV-2 viral copies this assay can detect is 138 copies/mL. A negative result does not preclude SARS-Cov-2 infection and should not be used as the sole basis for treatment or other patient management decisions. A negative result may occur with  improper specimen collection/handling, submission of specimen other than nasopharyngeal swab, presence of viral mutation(s) within the areas targeted by this assay, and inadequate number of viral copies(<138 copies/mL). A negative result must be combined with clinical observations, patient history, and epidemiological information. The expected result is Negative.  Fact Sheet for Patients:  EntrepreneurPulse.com.au  Fact Sheet for Healthcare Providers:   IncredibleEmployment.be  This test is no t yet approved or cleared by the [place] FDA and  has been authorized for detection and/or diagnosis of SARS-CoV-2 by FDA under an Emergency Use Authorization (EUA). This EUA will remain  in effect (meaning this test can be used) for the duration of the COVID-19 declaration under Section 564(b)(1) of the Act, 21 U.S.C.section 360bbb-3(b)(1), unless the authorization is terminated  or revoked sooner.       Influenza A by PCR NEGATIVE NEGATIVE Final   Influenza B by PCR NEGATIVE NEGATIVE Final    Comment: (NOTE) The Xpert Xpress SARS-CoV-2/FLU/RSV plus assay is intended as an aid in the diagnosis of influenza from Nasopharyngeal swab specimens and should not be used as a sole basis for treatment. Nasal washings and aspirates are unacceptable for Xpert Xpress SARS-CoV-2/FLU/RSV testing.  Fact Sheet for Patients: EntrepreneurPulse.com.au  Fact Sheet for Healthcare Providers: IncredibleEmployment.be  This test is not yet approved or cleared by the [place]  FDA and has been authorized for detection and/or diagnosis of SARS-CoV-2 by FDA under an Emergency Use Authorization (EUA). This EUA will remain in effect (meaning this test can be used) for the duration of the COVID-19 declaration under Section 564(b)(1) of the Act, 21 U.S.C. section 360bbb-3(b)(1), unless the authorization is terminated or revoked.     Resp Syncytial Virus by PCR POSITIVE (A) NEGATIVE Final    Comment: (NOTE) Fact Sheet for Patients: EntrepreneurPulse.com.au  Fact Sheet for Healthcare Providers: IncredibleEmployment.be  This test is not yet approved or cleared by the [place] FDA and has been authorized for detection and/or diagnosis of SARS-CoV-2 by FDA under an Emergency Use Authorization (EUA). This EUA will remain in effect (meaning this test can be used)  for the duration of the COVID-19 declaration under Section 564(b)(1) of the Act, 21 U.S.C. section 360bbb-3(b)(1), unless the authorization is terminated or revoked.  Performed at [facility], [address] [place], [place] [address]   Urine Culture     Status: Abnormal   Collection Time: 06/28/22 10:03 AM   Specimen: Urine, Clean Catch  Result Value Ref Range Status   Specimen Description   Final    URINE, CLEAN CATCH Performed at [facility], [address] [place], [place] [address]    Special Requests   Final    NONE Performed at [facility], [address] [place], [place] [address]    Culture MULTIPLE SPECIES PRESENT, SUGGEST RECOLLECTION (A)  Final   Report Status 06/29/2022 FINAL  Final    Labs: CBC: Recent Labs  Lab 06/28/22 0940 06/29/22 0228  WBC 7.1 7.4  NEUTROABS 6.1  --   HGB 10.0* 9.3*  HCT 30.9* 28.9*  MCV 88.8 90.3  PLT 159 142*   Basic Metabolic Panel: Recent Labs  Lab 06/28/22 0940 06/29/22 0228  NA 133* 137  K 3.7 3.7  CL 96* 101  CO2 25 24  GLUCOSE 139* 176*  BUN 14 13  CREATININE 0.84 0.72  CALCIUM 9.4 9.1   Liver Function Tests: Recent Labs  Lab 06/28/22 0940  AST 79*  ALT 92*  ALKPHOS 101  BILITOT 0.5  PROT 7.6  ALBUMIN 3.5   CBG: No results for input(s): "GLUCAP" in the last 168 hours.  Discharge time spent: greater than 30 minutes.  Signed: Elmarie Shiley, MD [facility] 06/30/2022

## 2022-06-30 NOTE — TOC Transition Note (Signed)
Transition of Care [facility]) - CM/SW Discharge Note   Patient Details  Name: Victoria Zimmerman MRN: [id number] Date of Birth: Jul 04, 1951  Transition of Care [facility]) CM/SW Contact:  Carles Collet, RN Phone Number: 06/30/2022, 11:48 AM   Clinical Narrative:     Nebulizer for home use will be delivered to the room for the patient to take home through [place].  Updated nurse.   Final next level of care: Home/Self Care Barriers to Discharge: No Barriers Identified   Patient Goals and CMS Choice        Discharge Placement                       Discharge Plan and Services                DME Arranged: Nebulizer machine DME Agency: [employer] Date DME Agency Contacted: 06/30/22 Time DME Agency Contacted: [phone] Representative spoke with at DME Agency: [place] ([place]) Interventions     Readmission Risk Interventions     No data to display

## 2022-06-30 NOTE — Progress Notes (Signed)
Nsg Discharge Note  Admit Date:  06/28/2022 Discharge date: 06/30/2022   Victoria Zimmerman [name] to be D/C'd Home per MD order.  AVS completed.  Copy for chart, and copy for patient signed, and dated. Removed IV-CDI. Reviewed d/c paperwork with patient and family. Answered all questions. Wheeled stable patient and belongings to main entrance where she was picked up by her husband to d/c to home. Discharge Medication: Allergies as of 06/30/2022       Reactions   Codeine Other (See Comments), Nausea Only        Medication List     TAKE these medications    acetaminophen 325 MG tablet Commonly known as: TYLENOL Take 650 mg by mouth every 6 (six) hours as needed for mild pain or moderate pain.   amLODipine 2.5 MG tablet Commonly known as: NORVASC Take 2.5 mg by mouth daily.   fluticasone 50 MCG/ACT nasal spray Commonly known as: FLONASE Place into both nostrils.   gabapentin 100 MG capsule Commonly known as: NEURONTIN Take 100 mg by mouth at bedtime.   HYDROcodone-acetaminophen 5-325 MG tablet Commonly known as: NORCO/VICODIN Take by mouth.   ipratropium-albuterol 0.5-2.5 (3) MG/3ML Soln Commonly known as: DUONEB Take 3 mLs by nebulization every 6 (six) hours.   letrozole 2.5 MG tablet Commonly known as: FEMARA Take 2.5 mg by mouth daily.   levothyroxine 112 MCG tablet Commonly known as: SYNTHROID Take 112 mcg by mouth daily before breakfast. ON AN EMPTY STOMACH   predniSONE 10 MG tablet Commonly known as: DELTASONE Take 4 tablets (40 mg total) by mouth daily with breakfast for 5 days.   rosuvastatin 10 MG tablet Commonly known as: CRESTOR Take 10 mg by mouth daily.               Durable Medical Equipment  (From admission, onward)           Start     Ordered   06/30/22 1138  For home use only DME Nebulizer machine  Once       Comments: Asthma exacerbation  Question Answer Comment  Patient needs a nebulizer to treat with the following condition  Bronchitis   Length of Need 6 Months      06/30/22 1138            Discharge Assessment: Vitals:   06/30/22 0914 06/30/22 1015  BP:  (!) 138/58  Pulse:  94  Resp:  18  Temp:  97.9 F (36.6 C)  SpO2: 94% 98%   Skin clean, dry and intact without evidence of skin break down, no evidence of skin tears noted. IV catheter discontinued intact. Site without signs and symptoms of complications - no redness or edema noted at insertion site, patient denies c/o pain - only slight tenderness at site.  Dressing with slight pressure applied.  D/c Instructions-Education: Discharge instructions given to patient/family with verbalized understanding. D/c education completed with patient/family including follow up instructions, medication list, d/c activities limitations if indicated, with other d/c instructions as indicated by MD - patient able to verbalize understanding, all questions fully answered. Patient instructed to return to ED, call 911, or call MD for any changes in condition.  Patient escorted via [place], and D/C home via private auto.  Santa Lighter, RN 06/30/2022 1:47 PM

## 2022-06-30 NOTE — Plan of Care (Signed)
  Problem: Education: Goal: Knowledge of General Education information will improve Description: Including pain rating scale, medication(s)/side effects and non-pharmacologic comfort measures 06/30/2022 1346 by Santa Lighter, RN Outcome: Adequate for Discharge 06/30/2022 1006 by Santa Lighter, RN Outcome: Progressing   Problem: Health Behavior/Discharge Planning: Goal: Ability to manage health-related needs will improve Outcome: Adequate for Discharge   Problem: Clinical Measurements: Goal: Ability to maintain clinical measurements within normal limits will improve Outcome: Adequate for Discharge Goal: Will remain free from infection Outcome: Adequate for Discharge Goal: Diagnostic test results will improve Outcome: Adequate for Discharge Goal: Respiratory complications will improve Outcome: Adequate for Discharge Goal: Cardiovascular complication will be avoided Outcome: Adequate for Discharge   Problem: Activity: Goal: Risk for activity intolerance will decrease Outcome: Adequate for Discharge   Problem: Nutrition: Goal: Adequate nutrition will be maintained Outcome: Adequate for Discharge   Problem: Coping: Goal: Level of anxiety will decrease Outcome: Adequate for Discharge   Problem: Elimination: Goal: Will not experience complications related to bowel motility Outcome: Adequate for Discharge Goal: Will not experience complications related to urinary retention Outcome: Adequate for Discharge   Problem: Pain Managment: Goal: General experience of comfort will improve Outcome: Adequate for Discharge   Problem: Safety: Goal: Ability to remain free from injury will improve Outcome: Adequate for Discharge   Problem: Skin Integrity: Goal: Risk for impaired skin integrity will decrease Outcome: Adequate for Discharge

## 2022-06-30 NOTE — Plan of Care (Signed)
  Problem: Education: Goal: Knowledge of General Education information will improve Description Including pain rating scale, medication(s)/side effects and non-pharmacologic comfort measures Outcome: Progressing   

## 2022-06-30 NOTE — Progress Notes (Signed)
Mobility Specialist - Progress Note   06/30/22 1049  Mobility  Activity Ambulated independently in hallway  Level of Assistance Independent  Assistive Device None  Distance Ambulated (ft) 1100 ft  Activity Response Tolerated well  Mobility Referral Yes  $Mobility charge 1 Mobility    Pt received in recliner agreeable to mobility. Tolerated increased distance well, no complaints throughout. Left in recliner w/ call bell within reach and all needs met.   [place] Specialist Please contact via SecureChat or Rehab office at [phone]

## 2022-11-08 ENCOUNTER — Encounter (HOSPITAL_COMMUNITY): Payer: Self-pay

## 2022-11-08 ENCOUNTER — Emergency Department (HOSPITAL_COMMUNITY): Payer: No Typology Code available for payment source

## 2022-11-08 ENCOUNTER — Inpatient Hospital Stay (HOSPITAL_COMMUNITY)
Admission: EM | Admit: 2022-11-08 | Discharge: 2022-11-10 | DRG: 964 | Disposition: A | Discharge status: Home / Self Care | Payer: No Typology Code available for payment source | Attending: General Surgery | Admitting: General Surgery

## 2022-11-08 DIAGNOSIS — I10 Essential (primary) hypertension: Secondary | ICD-10-CM | POA: Diagnosis present

## 2022-11-08 DIAGNOSIS — Z79899 Other long term (current) drug therapy: Secondary | ICD-10-CM | POA: Diagnosis not present

## 2022-11-08 DIAGNOSIS — S32302A Unspecified fracture of left ilium, initial encounter for closed fracture: Principal | ICD-10-CM

## 2022-11-08 DIAGNOSIS — S93402A Sprain of unspecified ligament of left ankle, initial encounter: Secondary | ICD-10-CM | POA: Diagnosis present

## 2022-11-08 DIAGNOSIS — S60211A Contusion of right wrist, initial encounter: Secondary | ICD-10-CM | POA: Diagnosis present

## 2022-11-08 DIAGNOSIS — W2209XA Striking against other stationary object, initial encounter: Secondary | ICD-10-CM | POA: Diagnosis present

## 2022-11-08 DIAGNOSIS — S300XXA Contusion of lower back and pelvis, initial encounter: Secondary | ICD-10-CM | POA: Diagnosis present

## 2022-11-08 DIAGNOSIS — C50911 Malignant neoplasm of unspecified site of right female breast: Secondary | ICD-10-CM | POA: Diagnosis present

## 2022-11-08 DIAGNOSIS — S2241XA Multiple fractures of ribs, right side, initial encounter for closed fracture: Secondary | ICD-10-CM | POA: Diagnosis present

## 2022-11-08 DIAGNOSIS — S27321A Contusion of lung, unilateral, initial encounter: Secondary | ICD-10-CM | POA: Diagnosis present

## 2022-11-08 DIAGNOSIS — Z79890 Hormone replacement therapy (postmenopausal): Secondary | ICD-10-CM

## 2022-11-08 DIAGNOSIS — E663 Overweight: Secondary | ICD-10-CM | POA: Diagnosis present

## 2022-11-08 DIAGNOSIS — S32312A Displaced avulsion fracture of left ilium, initial encounter for closed fracture: Secondary | ICD-10-CM | POA: Diagnosis present

## 2022-11-08 DIAGNOSIS — Z79811 Long term (current) use of aromatase inhibitors: Secondary | ICD-10-CM

## 2022-11-08 DIAGNOSIS — E039 Hypothyroidism, unspecified: Secondary | ICD-10-CM | POA: Diagnosis present

## 2022-11-08 DIAGNOSIS — S32059A Unspecified fracture of fifth lumbar vertebra, initial encounter for closed fracture: Secondary | ICD-10-CM | POA: Diagnosis present

## 2022-11-08 DIAGNOSIS — Z885 Allergy status to narcotic agent status: Secondary | ICD-10-CM

## 2022-11-08 DIAGNOSIS — T148XXA Other injury of unspecified body region, initial encounter: Secondary | ICD-10-CM

## 2022-11-08 DIAGNOSIS — Y92410 Unspecified street and highway as the place of occurrence of the external cause: Secondary | ICD-10-CM

## 2022-11-08 DIAGNOSIS — Z923 Personal history of irradiation: Secondary | ICD-10-CM

## 2022-11-08 DIAGNOSIS — S32309A Unspecified fracture of unspecified ilium, initial encounter for closed fracture: Secondary | ICD-10-CM | POA: Diagnosis present

## 2022-11-08 DIAGNOSIS — Z6828 Body mass index (BMI) 28.0-28.9, adult: Secondary | ICD-10-CM

## 2022-11-08 LAB — COMPREHENSIVE METABOLIC PANEL
ALT: 44 U/L (ref 0–44)
AST: 46 U/L — ABNORMAL HIGH (ref 15–41)
Albumin: 3.5 g/dL (ref 3.5–5.0)
Alkaline Phosphatase: 69 U/L (ref 38–126)
Anion gap: 10 (ref 5–15)
BUN: 21 mg/dL (ref 8–23)
CO2: 24 mmol/L (ref 22–32)
Calcium: 9.3 mg/dL (ref 8.9–10.3)
Chloride: 101 mmol/L (ref 98–111)
Creatinine, Ser: 0.82 mg/dL (ref 0.44–1.00)
GFR, Estimated: >60 mL/min (ref >60)
Glucose, Bld: 119 mg/dL — ABNORMAL HIGH (ref 70–99)
Potassium: 3.9 mmol/L (ref 3.5–5.1)
Sodium: 135 mmol/L (ref 135–145)
Total Bilirubin: 0.5 mg/dL (ref 0.3–1.2)
Total Protein: 7.9 g/dL (ref 6.5–8.1)

## 2022-11-08 LAB — URINALYSIS, ROUTINE W REFLEX MICROSCOPIC
Bacteria, UA: NONE SEEN
Bilirubin Urine: NEGATIVE
Glucose, UA: NEGATIVE mg/dL
Ketones, ur: NEGATIVE mg/dL
Leukocytes,Ua: NEGATIVE
Nitrite: NEGATIVE
Protein, ur: NEGATIVE mg/dL
Specific Gravity, Urine: >1.046 — ABNORMAL HIGH (ref 1.005–1.030)
pH: 5 (ref 5.0–8.0)

## 2022-11-08 LAB — CBC
HCT: 31.3 % — ABNORMAL LOW (ref 36.0–46.0)
Hemoglobin: 10.1 g/dL — ABNORMAL LOW (ref 12.0–15.0)
MCH: 29.4 pg (ref 26.0–34.0)
MCHC: 32.3 g/dL (ref 30.0–36.0)
MCV: 91 fL (ref 80.0–100.0)
Platelets: 222 10*3/uL (ref 150–400)
RBC: 3.44 MIL/uL — ABNORMAL LOW (ref 3.87–5.11)
RDW: 15.5 % (ref 11.5–15.5)
WBC: 10.8 10*3/uL — ABNORMAL HIGH (ref 4.0–10.5)
nRBC: 0 % (ref 0.0–0.2)

## 2022-11-08 LAB — SAMPLE TO BLOOD BANK

## 2022-11-08 LAB — I-STAT CHEM 8, ED
BUN: 26 mg/dL — ABNORMAL HIGH (ref 8–23)
Calcium, Ion: 1.22 mmol/L (ref 1.15–1.40)
Chloride: 102 mmol/L (ref 98–111)
Creatinine, Ser: 0.7 mg/dL (ref 0.44–1.00)
Glucose, Bld: 115 mg/dL — ABNORMAL HIGH (ref 70–99)
HCT: 31 % — ABNORMAL LOW (ref 36.0–46.0)
Hemoglobin: 10.5 g/dL — ABNORMAL LOW (ref 12.0–15.0)
Potassium: 4 mmol/L (ref 3.5–5.1)
Sodium: 136 mmol/L (ref 135–145)
TCO2: 26 mmol/L (ref 22–32)

## 2022-11-08 LAB — LACTIC ACID, PLASMA: Lactic Acid, Venous: 1.4 mmol/L (ref 0.5–1.9)

## 2022-11-08 LAB — ETHANOL: Alcohol, Ethyl (B): <10 mg/dL (ref <10)

## 2022-11-08 LAB — PROTIME-INR
INR: 1.1 (ref 0.8–1.2)
Prothrombin Time: 14.2 seconds (ref 11.4–15.2)

## 2022-11-08 MED ORDER — METHOCARBAMOL 750 MG PO TABS
750.0000 mg | ORAL_TABLET | Freq: Three times a day (TID) | ORAL | Status: DC
  Administered 2022-11-09 – 2022-11-10 (×5): 750 mg via ORAL
  Filled 2022-11-08: qty 2
  Filled 2022-11-08 (×3): qty 1
  Filled 2022-11-08: qty 2

## 2022-11-08 MED ORDER — MORPHINE SULFATE (PF) 4 MG/ML IV SOLN
4.0000 mg | Freq: Once | INTRAVENOUS | Status: AC
  Administered 2022-11-08: 4 mg via INTRAVENOUS
  Filled 2022-11-08: qty 1

## 2022-11-08 MED ORDER — ONDANSETRON HCL 4 MG/2ML IJ SOLN: 4.0000 mg | Freq: Four times a day (QID) | INTRAMUSCULAR | Status: DC | PRN

## 2022-11-08 MED ORDER — ACETAMINOPHEN 325 MG PO TABS: 650.0000 mg | ORAL_TABLET | ORAL | Status: DC | PRN

## 2022-11-08 MED ORDER — ACETAMINOPHEN 500 MG PO TABS
1000.0000 mg | ORAL_TABLET | Freq: Once | ORAL | Status: AC
  Administered 2022-11-08: 1000 mg via ORAL
  Filled 2022-11-08: qty 2

## 2022-11-08 MED ORDER — HYDROCODONE-ACETAMINOPHEN 5-325 MG PO TABS
2.0000 | ORAL_TABLET | Freq: Four times a day (QID) | ORAL | Status: DC | PRN
  Administered 2022-11-09: 2 via ORAL
  Filled 2022-11-08: qty 2

## 2022-11-08 MED ORDER — LEVOTHYROXINE SODIUM 112 MCG PO TABS
112.0000 ug | ORAL_TABLET | Freq: Every day | ORAL | Status: DC
  Administered 2022-11-09 – 2022-11-10 (×2): 112 ug via ORAL
  Filled 2022-11-08 (×2): qty 1

## 2022-11-08 MED ORDER — TRAMADOL HCL 50 MG PO TABS
50.0000 mg | ORAL_TABLET | Freq: Four times a day (QID) | ORAL | Status: DC | PRN
  Administered 2022-11-09 – 2022-11-10 (×2): 50 mg via ORAL
  Filled 2022-11-08 (×2): qty 1

## 2022-11-08 MED ORDER — ONDANSETRON 4 MG PO TBDP: 4.0000 mg | ORAL_TABLET | Freq: Four times a day (QID) | ORAL | Status: DC | PRN

## 2022-11-08 MED ORDER — ONDANSETRON HCL 4 MG/2ML IJ SOLN
4.0000 mg | Freq: Once | INTRAMUSCULAR | Status: AC
  Administered 2022-11-08: 4 mg via INTRAVENOUS
  Filled 2022-11-08: qty 2

## 2022-11-08 MED ORDER — MORPHINE SULFATE (PF) 4 MG/ML IV SOLN: 4.0000 mg | INTRAVENOUS | Status: DC | PRN

## 2022-11-08 MED ORDER — POTASSIUM CHLORIDE IN NACL 20-0.9 MEQ/L-% IV SOLN
INTRAVENOUS | Status: DC
  Filled 2022-11-08: qty 1000

## 2022-11-08 MED ORDER — IOHEXOL 350 MG/ML SOLN
75.0000 mL | Freq: Once | INTRAVENOUS | Status: AC | PRN
  Administered 2022-11-08: 75 mL via INTRAVENOUS

## 2022-11-08 MED ORDER — GABAPENTIN 100 MG PO CAPS
100.0000 mg | ORAL_CAPSULE | Freq: Every day | ORAL | Status: DC
  Administered 2022-11-09 (×2): 100 mg via ORAL
  Filled 2022-11-08 (×2): qty 1

## 2022-11-08 MED ORDER — AMLODIPINE BESYLATE 2.5 MG PO TABS
2.5000 mg | ORAL_TABLET | Freq: Every day | ORAL | Status: DC
  Administered 2022-11-08 – 2022-11-10 (×3): 2.5 mg via ORAL
  Filled 2022-11-08 (×3): qty 1

## 2022-11-08 MED ORDER — IPRATROPIUM-ALBUTEROL 0.5-2.5 (3) MG/3ML IN SOLN
3.0000 mL | Freq: Four times a day (QID) | RESPIRATORY_TRACT | Status: DC
  Administered 2022-11-09: 3 mL via RESPIRATORY_TRACT
  Filled 2022-11-08 (×2): qty 3

## 2022-11-08 MED ORDER — FLUTICASONE PROPIONATE 50 MCG/ACT NA SUSP
1.0000 | Freq: Every day | NASAL | Status: DC
  Filled 2022-11-08 (×2): qty 16
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
## 2022-11-08 NOTE — ED Notes (Signed)
TRN notified of pt's presence in dept.
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
## 2022-11-08 NOTE — ED Notes (Signed)
Asked secretary to order a pelvic binder for the patient for the abdominal hematoma.

## 2022-11-08 NOTE — ED Notes (Signed)
Pt is resting comfortably with family at bedside.  

## 2022-11-08 NOTE — Progress Notes (Signed)
Called in regards to this patients CT which shows a mild compression fracture of L5 with no retropulsion in to the canal. Recomment LSO brace when OOB.

## 2022-11-08 NOTE — ED Notes (Signed)
TRN in triage to evaluate pt
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
## 2022-11-09 ENCOUNTER — Inpatient Hospital Stay (HOSPITAL_COMMUNITY): Payer: No Typology Code available for payment source

## 2022-11-09 ENCOUNTER — Other Ambulatory Visit: Payer: Self-pay

## 2022-11-09 LAB — CBC
HCT: 25.9 % — ABNORMAL LOW (ref 36.0–46.0)
Hemoglobin: 8.6 g/dL — ABNORMAL LOW (ref 12.0–15.0)
MCH: 29.9 pg (ref 26.0–34.0)
MCHC: 33.2 g/dL (ref 30.0–36.0)
MCV: 89.9 fL (ref 80.0–100.0)
Platelets: 156 10*3/uL (ref 150–400)
RBC: 2.88 MIL/uL — ABNORMAL LOW (ref 3.87–5.11)
RDW: 15.7 % — ABNORMAL HIGH (ref 11.5–15.5)
WBC: 6.8 10*3/uL (ref 4.0–10.5)
nRBC: 0 % (ref 0.0–0.2)

## 2022-11-09 LAB — BASIC METABOLIC PANEL
Anion gap: 7 (ref 5–15)
BUN: 20 mg/dL (ref 8–23)
CO2: 25 mmol/L (ref 22–32)
Calcium: 8.6 mg/dL — ABNORMAL LOW (ref 8.9–10.3)
Chloride: 105 mmol/L (ref 98–111)
Creatinine, Ser: 0.86 mg/dL (ref 0.44–1.00)
GFR, Estimated: >60 mL/min (ref >60)
Glucose, Bld: 118 mg/dL — ABNORMAL HIGH (ref 70–99)
Potassium: 3.7 mmol/L (ref 3.5–5.1)
Sodium: 137 mmol/L (ref 135–145)

## 2022-11-09 MED ORDER — OXYCODONE HCL 5 MG PO TABS
5.0000 mg | ORAL_TABLET | ORAL | Status: DC | PRN
  Administered 2022-11-09 – 2022-11-10 (×5): 10 mg via ORAL
  Filled 2022-11-09 (×5): qty 2

## 2022-11-09 MED ORDER — IPRATROPIUM-ALBUTEROL 0.5-2.5 (3) MG/3ML IN SOLN: 3.0000 mL | RESPIRATORY_TRACT | Status: DC | PRN

## 2022-11-09 MED ORDER — ACETAMINOPHEN 325 MG PO TABS: 650.0000 mg | ORAL_TABLET | Freq: Four times a day (QID) | ORAL | Status: DC | PRN

## 2022-11-09 MED ORDER — LIDOCAINE 5 % EX PTCH
1.0000 | MEDICATED_PATCH | CUTANEOUS | Status: DC
  Administered 2022-11-09 – 2022-11-10 (×2): 1 via TRANSDERMAL
  Filled 2022-11-09 (×2): qty 1
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
## 2022-11-09 NOTE — Plan of Care (Signed)
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
## 2022-11-09 NOTE — Progress Notes (Signed)
Orthopedic Tech Progress Note Patient Details:  Victoria Zimmerman [birth year] [id number]  Ortho Devices Type of Ortho Device: CAM walker, Lumbar corsett Ortho Device/Splint Location: LLE Ortho Device/Splint Interventions: Ordered, Application, Adjustment, Removal   Post Interventions Patient Tolerated: Well Instructions Provided: Care of device  Donald Pore 11/09/2022, 1:27 PM
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
## 2022-11-10 LAB — CBC
HCT: 23.8 % — ABNORMAL LOW (ref 36.0–46.0)
HCT: 25.6 % — ABNORMAL LOW (ref 36.0–46.0)
Hemoglobin: 7.8 g/dL — ABNORMAL LOW (ref 12.0–15.0)
Hemoglobin: 7.9 g/dL — ABNORMAL LOW (ref 12.0–15.0)
MCH: 29.9 pg (ref 26.0–34.0)
MCH: 28.8 pg (ref 26.0–34.0)
MCHC: 32.8 g/dL (ref 30.0–36.0)
MCHC: 30.9 g/dL (ref 30.0–36.0)
MCV: 91.2 fL (ref 80.0–100.0)
MCV: 93.4 fL (ref 80.0–100.0)
Platelets: 139 10*3/uL — ABNORMAL LOW (ref 150–400)
Platelets: 148 10*3/uL — ABNORMAL LOW (ref 150–400)
RBC: 2.61 MIL/uL — ABNORMAL LOW (ref 3.87–5.11)
RBC: 2.74 MIL/uL — ABNORMAL LOW (ref 3.87–5.11)
RDW: 15.9 % — ABNORMAL HIGH (ref 11.5–15.5)
RDW: 16.2 % — ABNORMAL HIGH (ref 11.5–15.5)
WBC: 5.3 10*3/uL (ref 4.0–10.5)
WBC: 5.8 10*3/uL (ref 4.0–10.5)
nRBC: 0 % (ref 0.0–0.2)
nRBC: 0 % (ref 0.0–0.2)

## 2022-11-10 MED ORDER — FERROUS SULFATE 325 (65 FE) MG PO TABS: 325.0000 mg | ORAL_TABLET | Freq: Two times a day (BID) | ORAL | 0 refills | Status: AC

## 2022-11-10 MED ORDER — FERROUS SULFATE 325 (65 FE) MG PO TABS: 325.0000 mg | ORAL_TABLET | Freq: Two times a day (BID) | ORAL | Status: DC

## 2022-11-10 MED ORDER — OXYCODONE HCL 5 MG PO TABS: 5.0000 mg | ORAL_TABLET | Freq: Four times a day (QID) | ORAL | 0 refills | Status: AC | PRN

## 2022-11-10 MED ORDER — ASCORBIC ACID 500 MG PO TABS: 500.0000 mg | ORAL_TABLET | Freq: Two times a day (BID) | ORAL | 0 refills | Status: AC

## 2022-11-10 MED ORDER — LORATADINE 10 MG PO TABS
10.0000 mg | ORAL_TABLET | Freq: Every day | ORAL | Status: DC
  Administered 2022-11-10: 10 mg via ORAL
  Filled 2022-11-10: qty 1

## 2022-11-10 MED ORDER — TRAMADOL HCL 50 MG PO TABS: 50.0000 mg | ORAL_TABLET | Freq: Four times a day (QID) | ORAL | 0 refills | Status: AC | PRN

## 2022-11-10 MED ORDER — SIMETHICONE 80 MG PO CHEW: 80.0000 mg | CHEWABLE_TABLET | Freq: Four times a day (QID) | ORAL | Status: DC | PRN

## 2022-11-10 MED ORDER — DOCUSATE SODIUM 100 MG PO CAPS: 100.0000 mg | ORAL_CAPSULE | Freq: Every day | ORAL | 0 refills | Status: AC

## 2022-11-10 MED ORDER — VITAMIN C 500 MG PO TABS
500.0000 mg | ORAL_TABLET | Freq: Two times a day (BID) | ORAL | Status: DC
  Administered 2022-11-10: 500 mg via ORAL
  Filled 2022-11-10: qty 1

## 2022-11-10 MED ORDER — POLYETHYLENE GLYCOL 3350 17 G PO PACK
17.0000 g | PACK | Freq: Every day | ORAL | Status: DC
  Administered 2022-11-10: 17 g via ORAL
  Filled 2022-11-10: qty 1

## 2022-11-10 MED ORDER — METHOCARBAMOL 750 MG PO TABS: 750.0000 mg | ORAL_TABLET | Freq: Three times a day (TID) | ORAL | 0 refills | Status: AC

## 2022-11-10 MED ORDER — POLYETHYLENE GLYCOL 3350 17 G PO PACK: 17.0000 g | PACK | Freq: Every day | ORAL | Status: AC | PRN

## 2022-11-10 MED ORDER — DOCUSATE SODIUM 100 MG PO CAPS
100.0000 mg | ORAL_CAPSULE | Freq: Two times a day (BID) | ORAL | Status: DC
  Administered 2022-11-10: 100 mg via ORAL
  Filled 2022-11-10: qty 1

## 2022-11-10 NOTE — Progress Notes (Signed)
Discharge instructions given to patient and family. No further questions at this time. Iv access removed. Mrs Victoria Zimmerman is alert and oriented at time of discharge in no obvious distress. Left the unit in no obvious distress in wheelchair accompanied by transport personnel, daughters  and spouse.
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
## 2022-11-10 NOTE — Plan of Care (Signed)
  Problem: Education: Goal: Knowledge of General Education information will improve Description: Including pain rating scale, medication(s)/side effects and non-pharmacologic comfort measures 11/10/2022 0958 by Myrle Sheng, RN Outcome: Progressing 11/10/2022 0953 by Myrle Sheng, RN Outcome: Progressing   Problem: Health Behavior/Discharge Planning: Goal: Ability to manage health-related needs will improve 11/10/2022 0958 by Myrle Sheng, RN Outcome: Progressing 11/10/2022 0953 by Myrle Sheng, RN Outcome: Progressing   Problem: Clinical Measurements: Goal: Ability to maintain clinical measurements within normal limits will improve 11/10/2022 0958 by Myrle Sheng, RN Outcome: Progressing 11/10/2022 0953 by Myrle Sheng, RN Outcome: Progressing Goal: Will remain free from infection 11/10/2022 0958 by Myrle Sheng, RN Outcome: Progressing 11/10/2022 0953 by Myrle Sheng, RN Outcome: Progressing Goal: Diagnostic test results will improve 11/10/2022 0958 by Myrle Sheng, RN Outcome: Progressing 11/10/2022 0953 by Myrle Sheng, RN Outcome: Progressing Goal: Respiratory complications will improve 11/10/2022 0958 by Myrle Sheng, RN Outcome: Progressing 11/10/2022 0953 by Myrle Sheng, RN Outcome: Progressing Goal: Cardiovascular complication will be avoided 11/10/2022 0958 by Myrle Sheng, RN Outcome: Progressing 11/10/2022 0953 by Myrle Sheng, RN Outcome: Progressing   Problem: Activity: Goal: Risk for activity intolerance will decrease 11/10/2022 0958 by Myrle Sheng, RN Outcome: Progressing 11/10/2022 0953 by Myrle Sheng, RN Outcome: Progressing   Problem: Nutrition: Goal: Adequate nutrition will be maintained 11/10/2022 0958 by Myrle Sheng, RN Outcome: Progressing 11/10/2022 0953 by Myrle Sheng, RN Outcome: Progressing   Problem: Coping: Goal: Level of anxiety will  decrease 11/10/2022 0958 by Myrle Sheng, RN Outcome: Progressing 11/10/2022 0953 by Myrle Sheng, RN Outcome: Progressing   Problem: Elimination: Goal: Will not experience complications related to bowel motility 11/10/2022 0958 by Myrle Sheng, RN Outcome: Progressing 11/10/2022 0953 by Myrle Sheng, RN Outcome: Progressing Goal: Will not experience complications related to urinary retention 11/10/2022 0958 by Myrle Sheng, RN Outcome: Progressing 11/10/2022 0953 by Myrle Sheng, RN Outcome: Progressing   Problem: Pain Managment: Goal: General experience of comfort will improve 11/10/2022 0958 by Myrle Sheng, RN Outcome: Progressing 11/10/2022 0953 by Myrle Sheng, RN Outcome: Progressing   Problem: Safety: Goal: Ability to remain free from injury will improve 11/10/2022 0958 by Myrle Sheng, RN Outcome: Progressing 11/10/2022 0953 by Myrle Sheng, RN Outcome: Progressing   Problem: Skin Integrity: Goal: Risk for impaired skin integrity will decrease 11/10/2022 0958 by Myrle Sheng, RN Outcome: Progressing 11/10/2022 0953 by Myrle Sheng, RN Outcome: Progressing
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
## 2022-11-10 NOTE — TOC CAGE-AID Note (Signed)
Transition of Care [facility]) - CAGE-AID Screening   Patient Details  Name: Victoria Zimmerman MRN: [id number] Date of Birth: Feb 24, 1951  [facility]) CM/SW Contact:    Leota Sauers, RN Phone Number: 11/10/2022, 3:35 AM   Clinical Narrative:  Patient denies alcohol and drug use. Education not offered at this time.  CAGE-AID Screening:    Have You Ever Felt You Ought to Cut Down on Your Drinking or Drug Use?: No Have People Annoyed You By Critizing Your Drinking Or Drug Use?: No Have You Felt Bad Or Guilty About Your Drinking Or Drug Use?: No Have You Ever Had a Drink or Used Drugs First Thing In The Morning to Steady Your Nerves or to Get Rid of a Hangover?: No CAGE-AID Score: 0  Substance Abuse Education Offered: No

## 2022-11-10 NOTE — Plan of Care (Signed)
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
## 2022-12-21 ENCOUNTER — Other Ambulatory Visit (HOSPITAL_COMMUNITY): Payer: Self-pay | Admitting: Student

## 2022-12-21 DIAGNOSIS — S32050A Wedge compression fracture of fifth lumbar vertebra, initial encounter for closed fracture: Secondary | ICD-10-CM

## 2022-12-30 ENCOUNTER — Ambulatory Visit (HOSPITAL_BASED_OUTPATIENT_CLINIC_OR_DEPARTMENT_OTHER): Payer: No Typology Code available for payment source

## 2023-01-11 ENCOUNTER — Ambulatory Visit (HOSPITAL_BASED_OUTPATIENT_CLINIC_OR_DEPARTMENT_OTHER)
Admission: RE | Admit: 2023-01-11 | Discharge: 2023-01-11 | Disposition: A | Discharge status: Home / Self Care | Payer: Medicare Other | Source: Ambulatory Visit | Attending: Student | Admitting: Student

## 2023-01-11 DIAGNOSIS — S32050A Wedge compression fracture of fifth lumbar vertebra, initial encounter for closed fracture: Secondary | ICD-10-CM | POA: Insufficient documentation

## 2023-11-30 IMAGING — MG MM BREAST LOCALIZATION CLIP
4 series · 4 of 12 positions shown · non-contrast
Comparison: Previous exam(s).

CLINICAL DATA: Status post right breast ultrasound-guided biopsy.

EXAM:
3D DIAGNOSTIC RIGHT MAMMOGRAM POST ULTRASOUND BIOPSY

[R CC synth-2D]
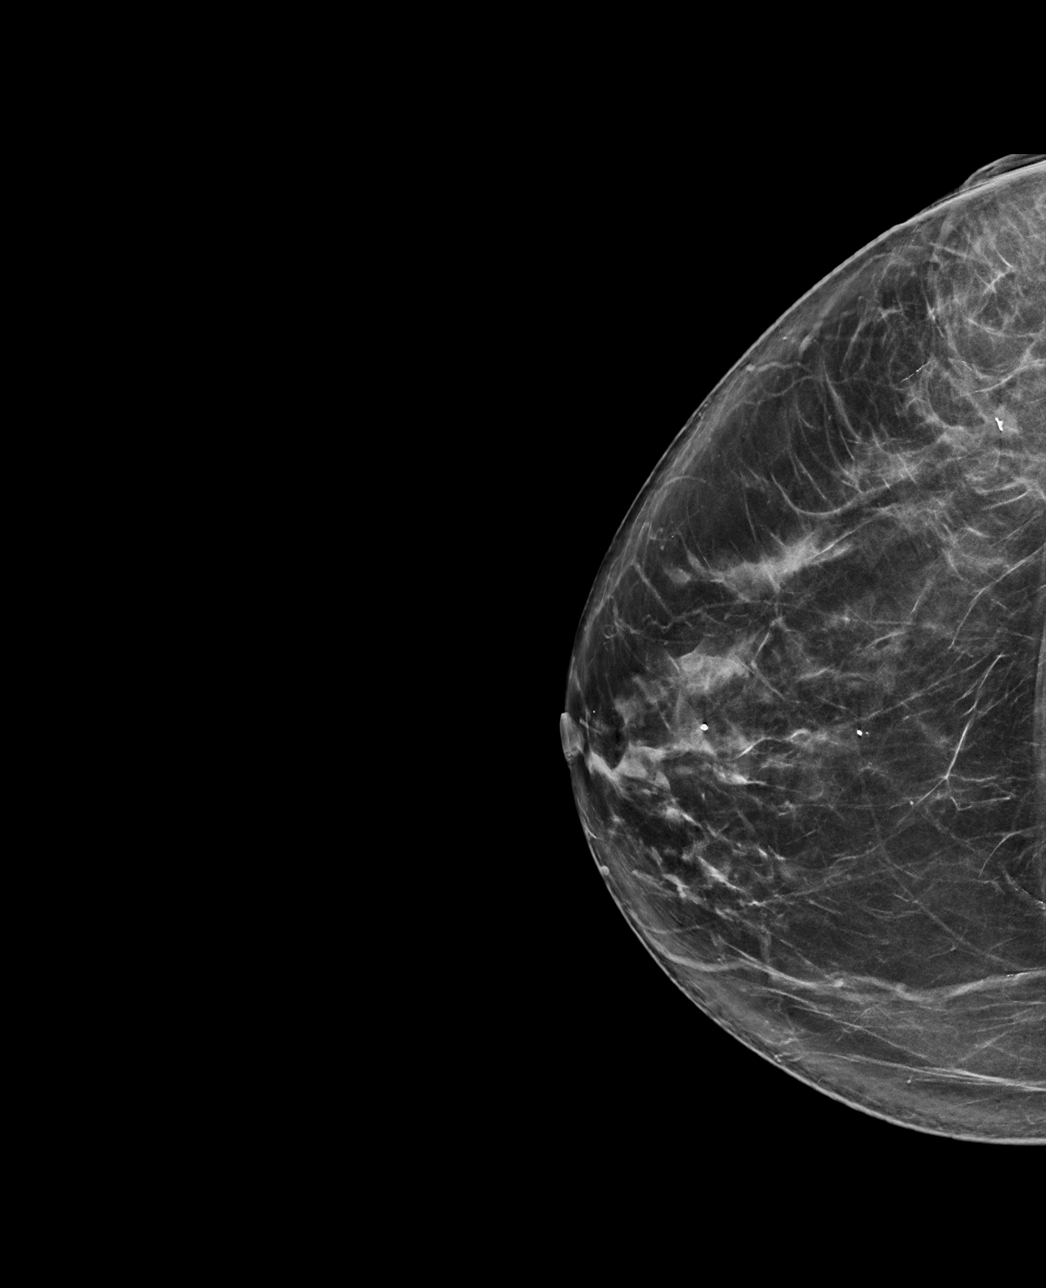

[R ML synth-2D]
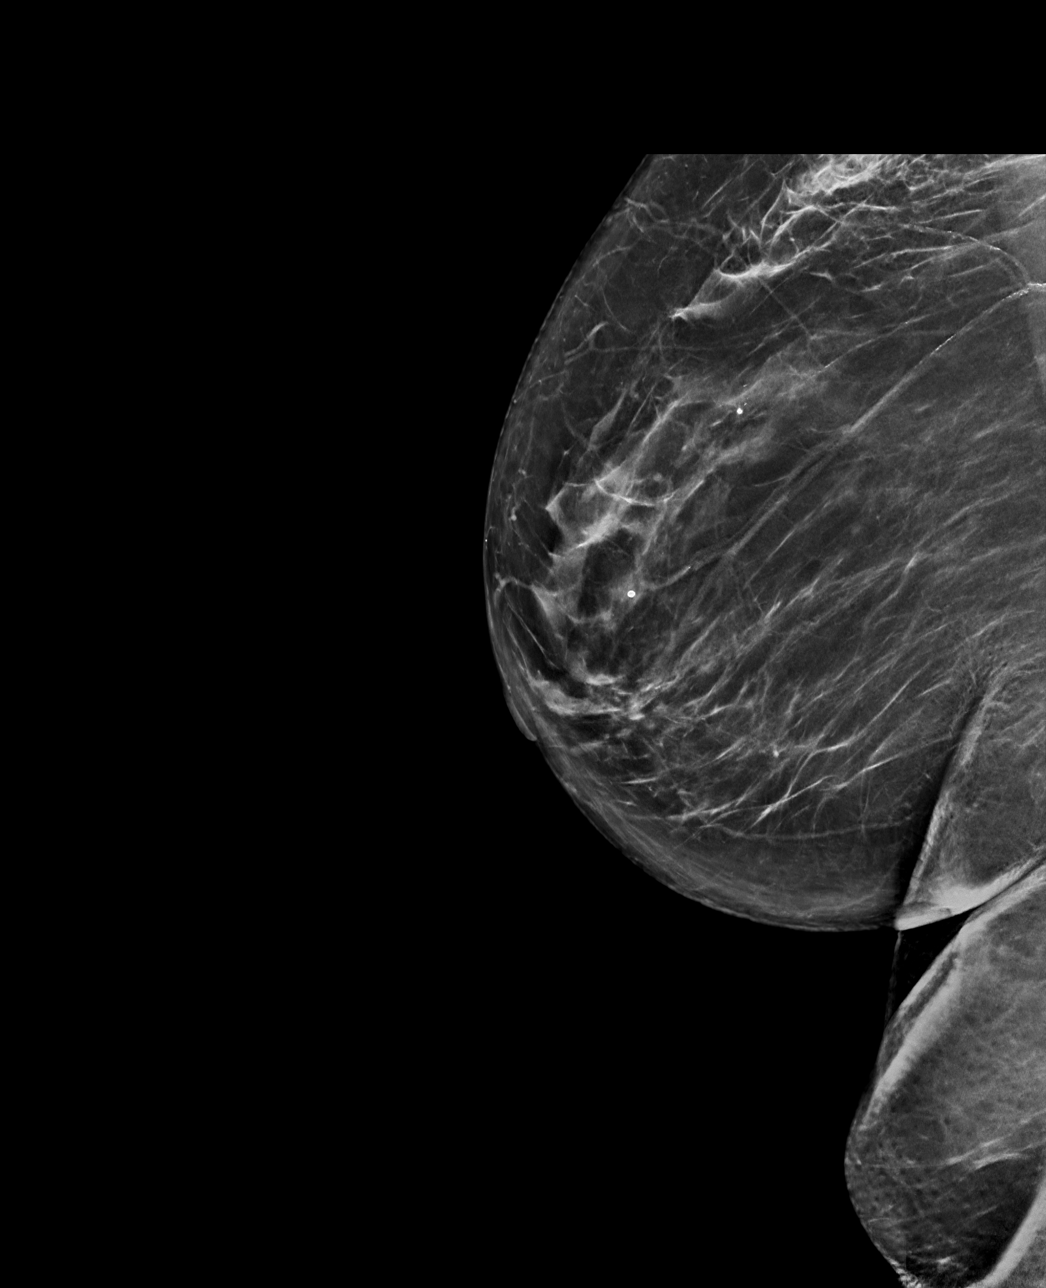

[R ML tomo · tomo slice 41/81.0]
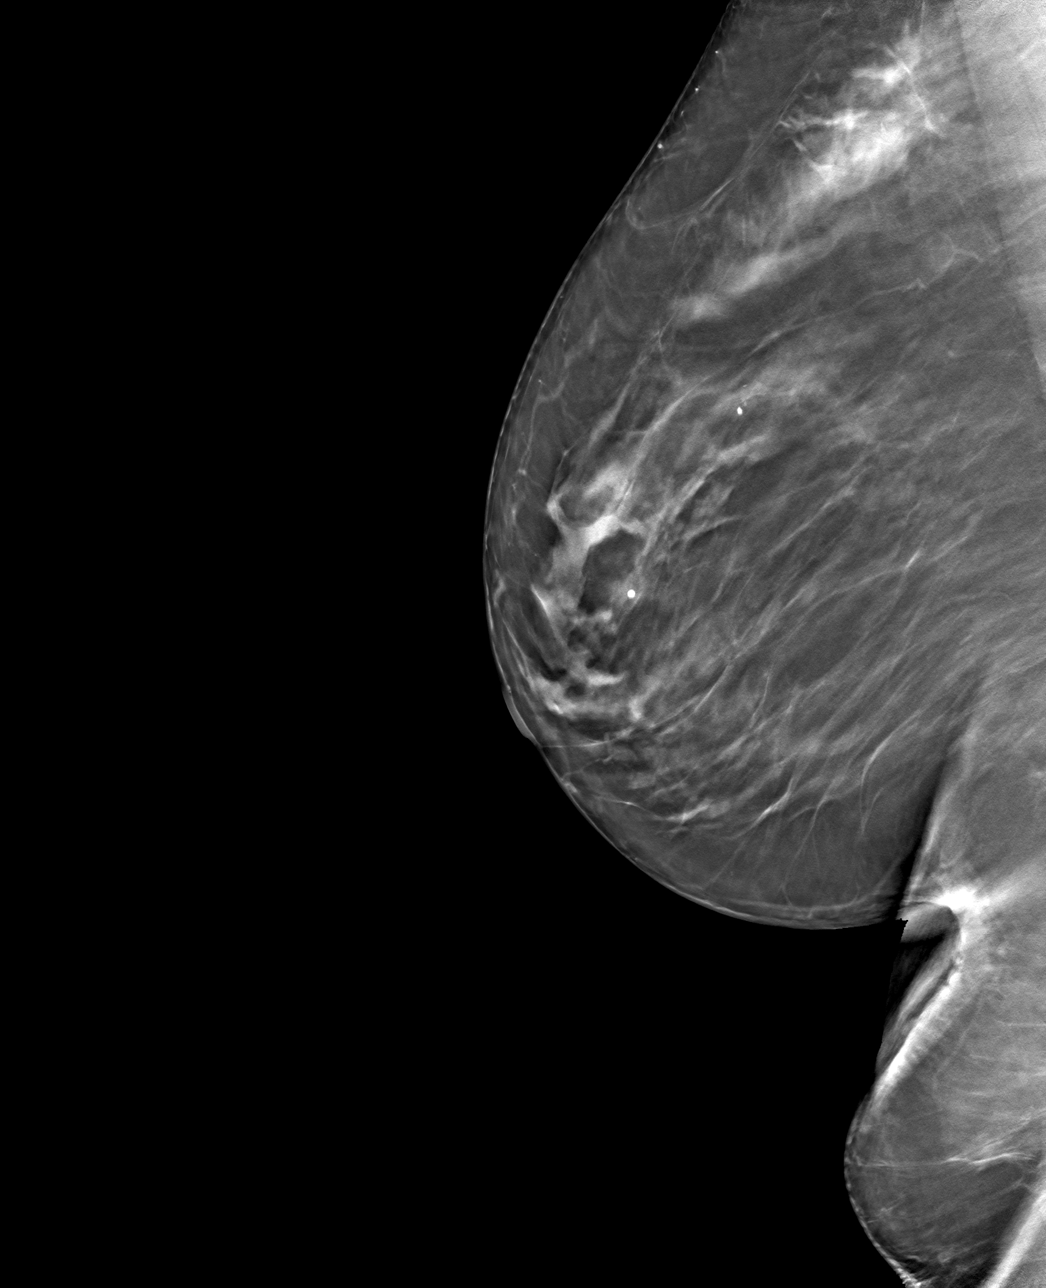

[R CC tomo · tomo slice 40/79.0]
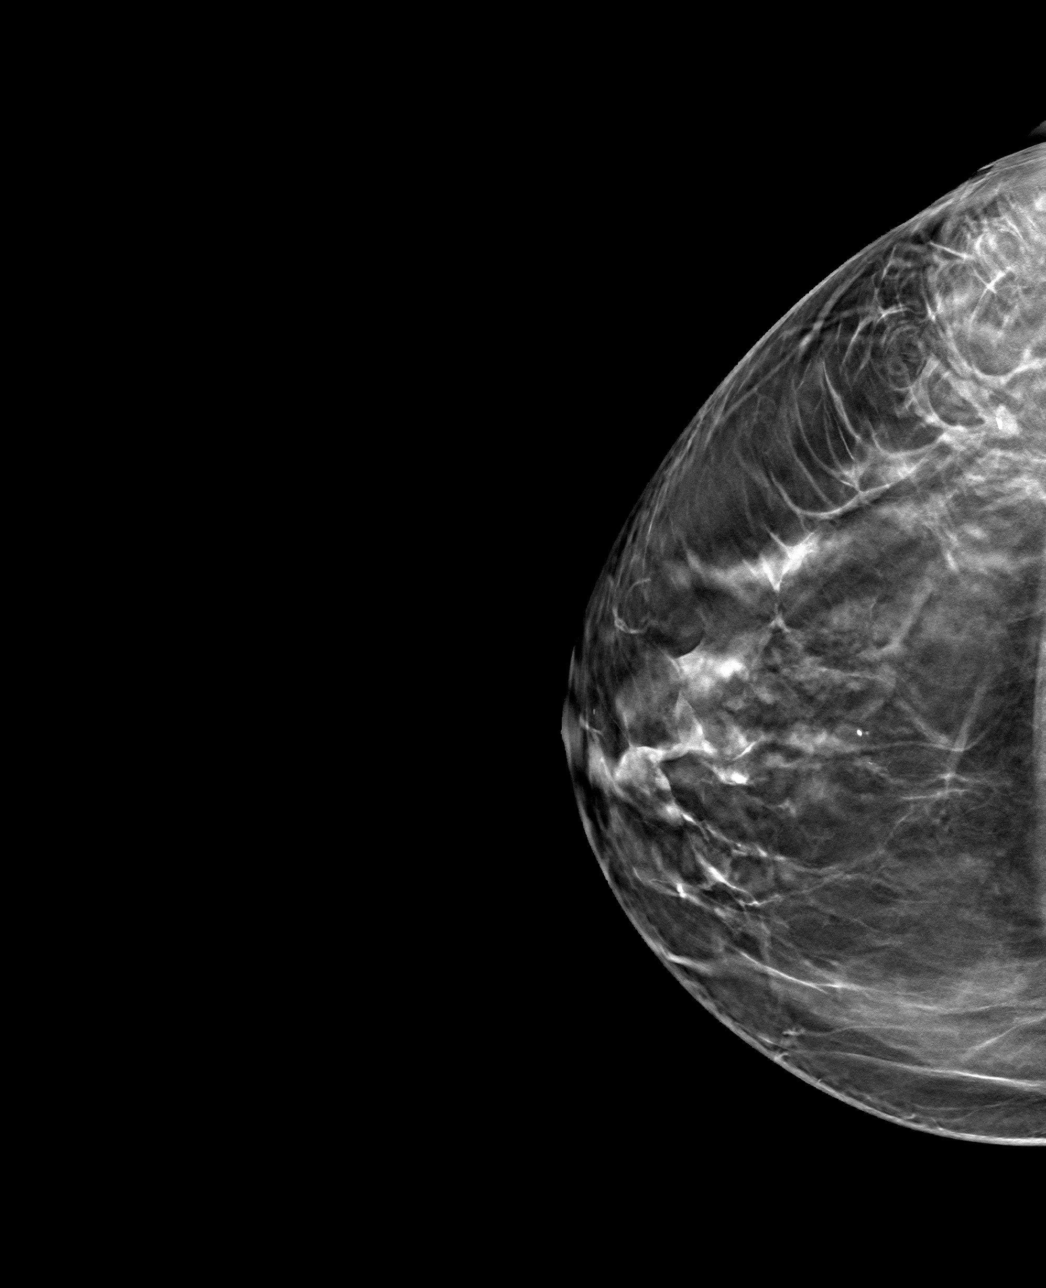

[4 of 12 positions shown; findings below may reference images not displayed]

FINDINGS: 3D Mammographic images were obtained following ultrasound guided
biopsy of the right breast. The biopsy marking clip is in expected
position at the site of biopsy.
IMPRESSION: Appropriate positioning of the ribbon shaped biopsy marking clip at
the site of biopsy in the upper-outer right breast.

Final Assessment: Post Procedure Mammograms for Marker Placement

## 2023-11-30 IMAGING — US US  BREAST BX W/ LOC DEV 1ST LESION IMG BX SPEC US GUIDE*R*
1 series · 9 of 9 positions shown · non-contrast
Comparison: Previous exam(s).
COMPARISON: Previous exam(s).

Addendum:
CLINICAL DATA: 70-year-old female with a suspicious right breast
mass.

EXAM:
ULTRASOUND GUIDED RIGHT BREAST CORE NEEDLE BIOPSY

[Series 1: us breast bx w/ loc dev 1st lesion img bx spec us  · 0.06mm/px · 9 of 9 slices shown]
[im 1/9]
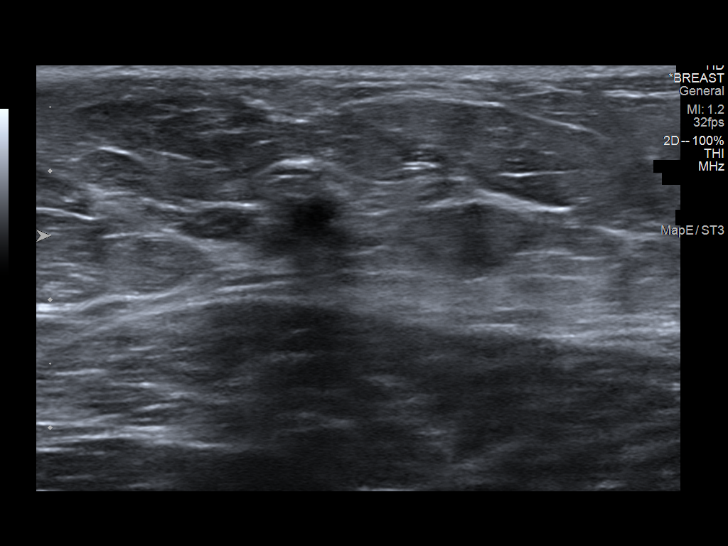
[im 2/9]
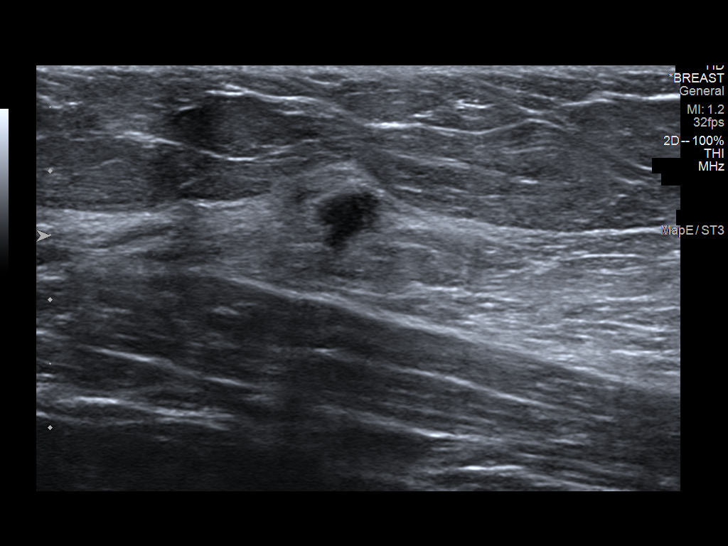
[im 3/9]
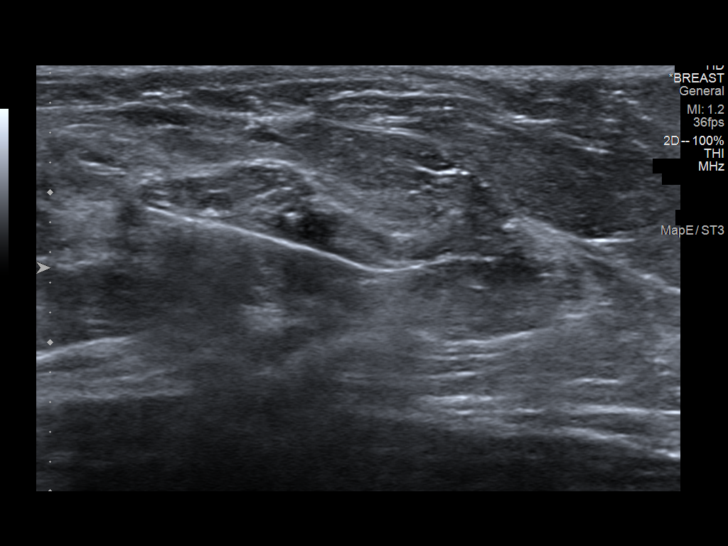
[im 4/9]
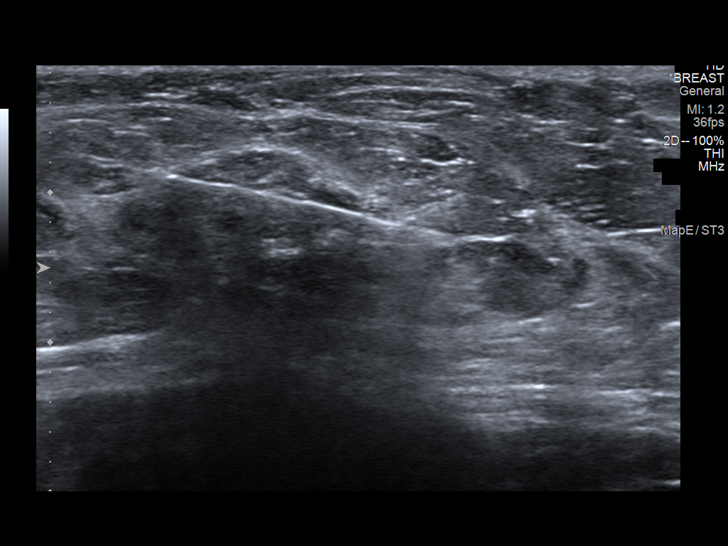
[im 5/9]
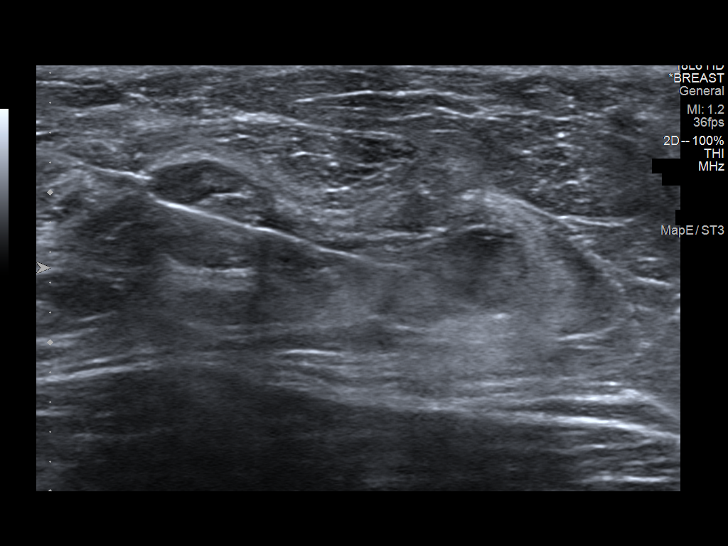
[im 6/9]
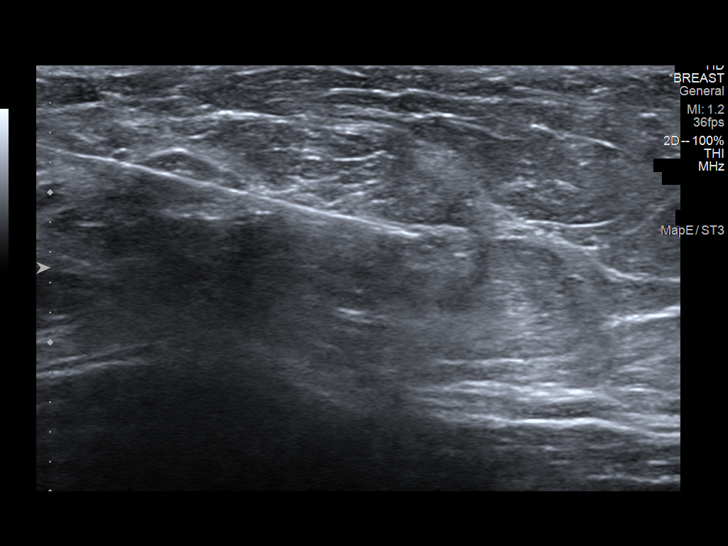
[im 7/9]
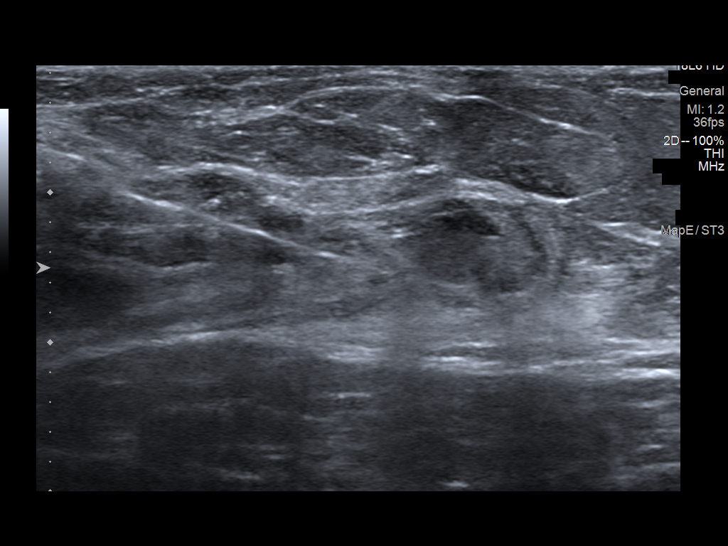
[im 8/9]
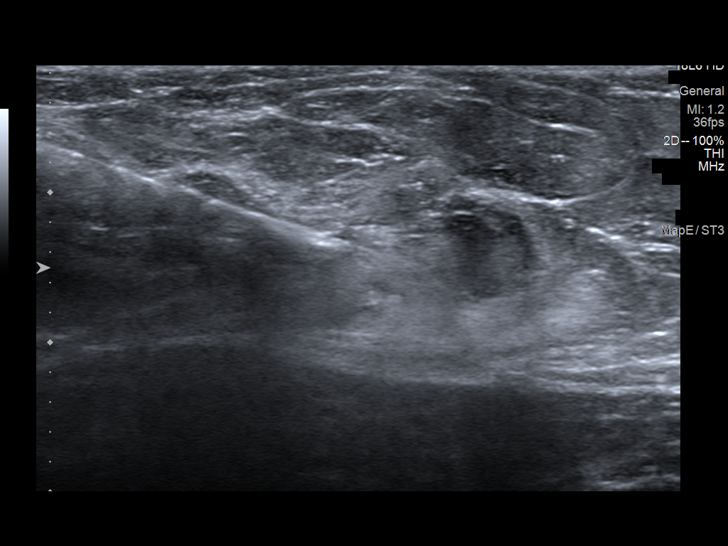
[im 9/9]
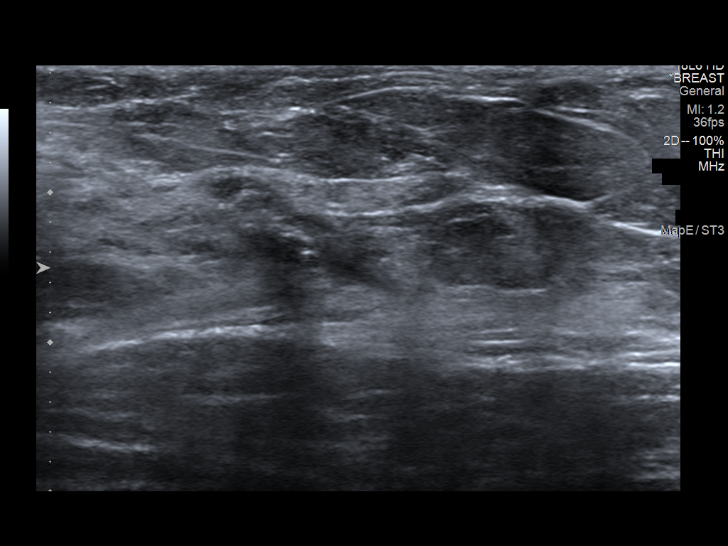

[9 of 9 positions shown; findings below may reference images not displayed]



Lesion quadrant: Upper outer quadrant

Using sterile technique and 1% Lidocaine as local anesthetic, under
direct ultrasound visualization, a 14 gauge Verisan device was
used to perform biopsy of a mass at the 10 o'clock position of the
right breast using a lateral approach. At the conclusion of the
procedure a ribbon shaped tissue marker clip was deployed into the
biopsy cavity. Follow up 2 view mammogram was performed and dictated
separately.
IMPRESSION: Ultrasound guided biopsy of the right breast. No apparent
complications.

ADDENDUM:
Pathology revealed GRADE II INVASIVE DUCTAL CARCINOMA of the RIGHT
breast, 10 o'clock, 5cmfn (ribbon clip). This was found to be
concordant by Dr. Shiro Lucci.

Pathology results were discussed with the patient by telephone. The
patient reported doing well after the biopsy with tenderness at the
site. Post biopsy instructions and care were reviewed and questions
were answered. The patient was encouraged to call The [REDACTED] for any additional concerns. My direct number
was provided.

Per patient request, procedure and pathology reports faxed to Dr.
Paulo Samuel Bagley at [[facility]. Patient is scheduled for
appointment at [HOSP[facility]] [facility] on July 17, 2021. This has been
confirmed with [facility] of [HOSPITAL] who has spoken with patient today.

Pathology results reported by Dinero Christie RN on 07/08/2021.



Lesion quadrant: Upper outer quadrant

Using sterile technique and 1% Lidocaine as local anesthetic, under
direct ultrasound visualization, a 14 gauge Verisan device was
used to perform biopsy of a mass at the 10 o'clock position of the
right breast using a lateral approach. At the conclusion of the
procedure a ribbon shaped tissue marker clip was deployed into the
biopsy cavity. Follow up 2 view mammogram was performed and dictated
separately.
IMPRESSION: Ultrasound guided biopsy of the right breast. No apparent
complications.
